# Patient Record
Sex: Female | Born: 1965 | Race: Black or African American | Hispanic: No | Marital: Single
Health system: Southern US, Community
[De-identification: ages and names within clinical notes are randomized; demographics above are authoritative.]

---

## 1997-09-27 ENCOUNTER — Other Ambulatory Visit: Admission: RE | Admit: 1997-09-27 | Discharge: 1997-09-27 | Payer: Self-pay | Admitting: Obstetrics and Gynecology

## 1998-05-13 ENCOUNTER — Other Ambulatory Visit: Admission: RE | Admit: 1998-05-13 | Discharge: 1998-05-13 | Payer: Self-pay | Admitting: Obstetrics and Gynecology

## 1999-05-19 ENCOUNTER — Other Ambulatory Visit: Admission: RE | Admit: 1999-05-19 | Discharge: 1999-05-19 | Payer: Self-pay | Admitting: *Deleted

## 2000-06-16 ENCOUNTER — Other Ambulatory Visit: Admission: RE | Admit: 2000-06-16 | Discharge: 2000-06-16 | Payer: Self-pay | Admitting: *Deleted

## 2000-09-12 ENCOUNTER — Ambulatory Visit (HOSPITAL_COMMUNITY): Admission: RE | Admit: 2000-09-12 | Discharge: 2000-09-12 | Payer: Self-pay | Admitting: *Deleted

## 2001-06-29 ENCOUNTER — Other Ambulatory Visit: Admission: RE | Admit: 2001-06-29 | Discharge: 2001-06-29 | Payer: Self-pay | Admitting: *Deleted

## 2001-12-06 ENCOUNTER — Encounter (INDEPENDENT_AMBULATORY_CARE_PROVIDER_SITE_OTHER): Payer: Self-pay

## 2001-12-06 ENCOUNTER — Observation Stay (HOSPITAL_COMMUNITY): Admission: RE | Admit: 2001-12-06 | Discharge: 2001-12-07 | Payer: Self-pay | Admitting: Obstetrics and Gynecology

## 2002-11-12 ENCOUNTER — Other Ambulatory Visit: Admission: RE | Admit: 2002-11-12 | Discharge: 2002-11-12 | Payer: Self-pay | Admitting: Obstetrics and Gynecology

## 2004-02-26 ENCOUNTER — Other Ambulatory Visit: Admission: RE | Admit: 2004-02-26 | Discharge: 2004-02-26 | Payer: Self-pay | Admitting: Obstetrics and Gynecology

## 2004-03-10 ENCOUNTER — Encounter: Admission: RE | Admit: 2004-03-10 | Discharge: 2004-03-10 | Payer: Self-pay | Admitting: Obstetrics and Gynecology

## 2005-03-24 ENCOUNTER — Other Ambulatory Visit: Admission: RE | Admit: 2005-03-24 | Discharge: 2005-03-24 | Payer: Self-pay | Admitting: Obstetrics and Gynecology

## 2009-06-18 ENCOUNTER — Emergency Department (HOSPITAL_COMMUNITY): Admission: EM | Admit: 2009-06-18 | Discharge: 2009-06-18 | Payer: Self-pay | Admitting: Emergency Medicine

## 2009-06-20 ENCOUNTER — Emergency Department (HOSPITAL_COMMUNITY): Admission: EM | Admit: 2009-06-20 | Discharge: 2009-06-20 | Payer: Self-pay | Admitting: Emergency Medicine

## 2009-07-25 ENCOUNTER — Ambulatory Visit (HOSPITAL_COMMUNITY): Admission: RE | Admit: 2009-07-25 | Discharge: 2009-07-25 | Payer: Self-pay | Admitting: General Surgery

## 2010-09-21 LAB — COMPREHENSIVE METABOLIC PANEL
ALT: 50 U/L — ABNORMAL HIGH (ref 0–35)
Albumin: 3.9 g/dL (ref 3.5–5.2)
Alkaline Phosphatase: 55 U/L (ref 39–117)
BUN: 9 mg/dL (ref 6–23)
Calcium: 9.7 mg/dL (ref 8.4–10.5)
Potassium: 3.4 mEq/L — ABNORMAL LOW (ref 3.5–5.1)
Sodium: 139 mEq/L (ref 135–145)
Total Protein: 7.1 g/dL (ref 6.0–8.3)

## 2010-09-21 LAB — CBC
HCT: 39 % (ref 36.0–46.0)
MCHC: 33.7 g/dL (ref 30.0–36.0)
MCV: 86 fL (ref 78.0–100.0)
RBC: 4.54 MIL/uL (ref 3.87–5.11)
WBC: 8.2 10*3/uL (ref 4.0–10.5)

## 2010-09-21 LAB — DIFFERENTIAL
Basophils Absolute: 0.1 10*3/uL (ref 0.0–0.1)
Eosinophils Absolute: 0.1 10*3/uL (ref 0.0–0.7)
Eosinophils Relative: 2 % (ref 0–5)
Lymphocytes Relative: 26 % (ref 12–46)
Neutrophils Relative %: 67 % (ref 43–77)

## 2010-10-05 LAB — POCT I-STAT, CHEM 8
BUN: 9 mg/dL (ref 6–23)
Calcium, Ion: 1.1 mmol/L — ABNORMAL LOW (ref 1.12–1.32)
Chloride: 99 mEq/L (ref 96–112)
Glucose, Bld: 150 mg/dL — ABNORMAL HIGH (ref 70–99)
Potassium: 2.9 mEq/L — ABNORMAL LOW (ref 3.5–5.1)

## 2010-10-05 LAB — URINALYSIS, ROUTINE W REFLEX MICROSCOPIC
Glucose, UA: NEGATIVE mg/dL
Hgb urine dipstick: NEGATIVE
Protein, ur: NEGATIVE mg/dL
Specific Gravity, Urine: 1.017 (ref 1.005–1.030)
pH: 6 (ref 5.0–8.0)

## 2010-10-05 LAB — DIFFERENTIAL
Basophils Relative: 1 % (ref 0–1)
Eosinophils Absolute: 0.2 10*3/uL (ref 0.0–0.7)
Lymphocytes Relative: 15 % (ref 12–46)

## 2010-10-05 LAB — CBC
HCT: 41.7 % (ref 36.0–46.0)
Hemoglobin: 13.7 g/dL (ref 12.0–15.0)
WBC: 11.4 10*3/uL — ABNORMAL HIGH (ref 4.0–10.5)

## 2010-10-05 LAB — URINE CULTURE: Colony Count: 15000

## 2010-10-05 LAB — HEPATIC FUNCTION PANEL
ALT: 253 U/L — ABNORMAL HIGH (ref 0–35)
AST: 151 U/L — ABNORMAL HIGH (ref 0–37)
Alkaline Phosphatase: 147 U/L — ABNORMAL HIGH (ref 39–117)
Bilirubin, Direct: 1.1 mg/dL — ABNORMAL HIGH (ref 0.0–0.3)
Total Bilirubin: 2 mg/dL — ABNORMAL HIGH (ref 0.3–1.2)

## 2010-10-05 LAB — LIPASE, BLOOD: Lipase: 20 U/L (ref 11–59)

## 2010-10-06 LAB — DIFFERENTIAL
Basophils Relative: 1 % (ref 0–1)
Lymphocytes Relative: 14 % (ref 12–46)
Lymphs Abs: 1.7 10*3/uL (ref 0.7–4.0)
Monocytes Absolute: 0.4 10*3/uL (ref 0.1–1.0)
Monocytes Relative: 3 % (ref 3–12)
Neutro Abs: 10.2 10*3/uL — ABNORMAL HIGH (ref 1.7–7.7)
Neutrophils Relative %: 82 % — ABNORMAL HIGH (ref 43–77)

## 2010-10-06 LAB — URINALYSIS, ROUTINE W REFLEX MICROSCOPIC
Bilirubin Urine: NEGATIVE
Glucose, UA: NEGATIVE mg/dL
Hgb urine dipstick: NEGATIVE
Nitrite: NEGATIVE
Protein, ur: NEGATIVE mg/dL

## 2010-10-06 LAB — COMPREHENSIVE METABOLIC PANEL
ALT: 38 U/L — ABNORMAL HIGH (ref 0–35)
AST: 67 U/L — ABNORMAL HIGH (ref 0–37)
Calcium: 9.5 mg/dL (ref 8.4–10.5)
Potassium: 3.1 mEq/L — ABNORMAL LOW (ref 3.5–5.1)
Sodium: 139 mEq/L (ref 135–145)
Total Protein: 7.3 g/dL (ref 6.0–8.3)

## 2010-10-06 LAB — LIPASE, BLOOD: Lipase: 22 U/L (ref 11–59)

## 2010-10-06 LAB — CBC
Hemoglobin: 12.7 g/dL (ref 12.0–15.0)
MCHC: 33.4 g/dL (ref 30.0–36.0)
MCV: 85.2 fL (ref 78.0–100.0)
RBC: 4.48 MIL/uL (ref 3.87–5.11)
RDW: 14.1 % (ref 11.5–15.5)

## 2010-10-06 LAB — CK TOTAL AND CKMB (NOT AT ARMC): Total CK: 98 U/L (ref 7–177)

## 2010-11-12 ENCOUNTER — Other Ambulatory Visit: Payer: Self-pay | Admitting: Family Medicine

## 2010-11-12 ENCOUNTER — Other Ambulatory Visit (HOSPITAL_COMMUNITY)
Admission: RE | Admit: 2010-11-12 | Discharge: 2010-11-12 | Disposition: A | Discharge status: Home / Self Care | Payer: 59 | Source: Ambulatory Visit | Attending: Family Medicine | Admitting: Family Medicine

## 2010-11-12 DIAGNOSIS — Z124 Encounter for screening for malignant neoplasm of cervix: Secondary | ICD-10-CM | POA: Insufficient documentation

## 2010-11-20 NOTE — Op Note (Signed)
[facility]  Patient:    Vickie, Wyatt                         MRN: [id number] Proc. Date: 09/12/00 Adm. Date:  [id number] Attending:  Marin Comment                           Operative Report  PREOPERATIVE DIAGNOSIS:  Desires permanent sterilization.  POSTOPERATIVE DIAGNOSIS:  Desires permanent sterilization.  OPERATION:  Bilateral tubal ligation with cautery using laparoscopic technique.  SURGEON:  Pershing Cox, M.D.  ANESTHESIA:  General endotracheal anesthesia.  INDICATIONS:  This patient is 45 years old.  She is gravida 0.  She is coitally active but has absolutely no plans for pregnancy.  Lengthy conversations were carried out on two occasions, and she is certain that she never wants to have a pregnancy and has been certain of this all of her life. She is coitally active.  She has been on oral contraceptives but has recently developed hypertension, and this has contraindicated the use of a birth control pill.  She continues to have hot flashes on oral contraceptives even though an [facility] done in February of this year was in the normal range.  After careful counselling, she requested tubal sterilization and is brought to the operating room today for this procedure.  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  Her last two Pap smears have been within normal limits.  DESCRIPTION OF PROCEDURE:  Torina Ey was brought to the operating room with an IV in place.  In the holding area, she received 1 g Ancef.  She was taken to the operating room, placed supine on the OR table, and general endotracheal anesthesia was administered.  She was then placed into Allen stirrups, and her anterior abdominal wall, perineum, and vagina were prepped with Hibiclens. Careful attention was paid to the umbilicus which was prepped with Q-tips. The patient was draped for an abdominal and vaginal  procedure.  Bivalve speculum was then inserted into the vagina.  A single-tooth tenaculum was used to grasp the anterior cervix.  Hulka tenaculum was easily inserted into the uterine cavity.  The umbilicus was everted using Allis clamps, and a curvilinear incision was made at the base of the umbilicus.  This was spread with Kelly clamp.  Veress needle was then used to pass into the peritoneal cavity.  This was accomplished with one pass.  Pressures were appropriate by lifting the anterior abdominal wall, and CO2 gas was insufflated until the patients abdominal pressure was 25.  The 10-11 trocar was introduced into the peritoneal cavity, and the pressures were inappropriate.  It was drawn back, and the pressures were still inappropriate.  The trocar was removed and reinserted.  This time, the pressures were appropriate. The laparoscope was introduced into the peritoneal cavity and photographs were taken of the abdomen immediately upon entry.  Lifting the Hulka tenaculum, the uterus was visualized, and it was appropriate to proceed with the tubal sterilization. (See operative findings.)  Using the Kleppinger bipolar cautery, the isthmic portion of the fallopian tube was grasped using cautery until complete coagulation was indicated by a reading of 0 transduction.  There were three burns on each tube.  A decision was made not to divide the tube because it would taken another port to safely do this.  This  was performed on both sides. Photographs were taken of each ovary and fallopian tube prior to cauterization and after cauterization.  Attempts were made to visualize the appendix, but I was not able to see the appendix due to the patients habitus.  The gas was evacuated from the peritoneal cavity after the laparoscope was removed. Marcaine 0.25% was injected into the fascia and into the subcutaneous tissues; 0 Vicryl was used to close the subcutaneous tissue over the fascia.  The fascial  incision itself was too deep to safely be closed.  Although attempts were made to try to grasp it, I felt that this was really unsafe.  Skin edges were brought together, and Dermabond was used to close the umbilical incision.  OPERATIVE FINDINGS:  The patients uterus was approximately 10 weeks in size. There were several subserosal and pedunculated myomas.  There are also several budding myomas arising from the uterine surface.  None of these seem to be involved in the broad ligament.  Condition at the end of surgery is stable.  Specimens none. DD:  09/12/00 TD:  09/13/00 Job: [id number]

## 2010-11-20 NOTE — Op Note (Signed)
[facility]  Patient:    Vickie Wyatt, Vickie Wyatt Visit Number: [id number] MRN: [id number]          Service Type: DSU Location: [id number] Attending Physician:  Soledad Gerlach Dictated by:   Guy Sandifer Arleta Creek, M.D. Proc. Date: 12/06/01 Admit Date:  12/06/2001 Discharge Date: 12/07/2001                             Operative Report  PREOPERATIVE DIAGNOSES:       1. Uterine leiomyomata.                               2. Menorrhagia.  POSTOPERATIVE DIAGNOSES:      1. Uterine leiomyomata.                               2. Menorrhagia.  OPERATION:                    Laparoscopic-assisted vaginal hysterectomy.  SURGEON:                      Guy Sandifer. Arleta Creek, M.D.  ASSISTANT:                    Trevor Iha, M.D.  ANESTHESIA:                   General with endotracheal intubation - J.                               Leilani Able, Montez Hageman., M.D.  ESTIMATED BLOOD LOSS:         200 cc.  INDICATIONS AND CONSENT:      The patient is a 45 year old single black female G0, P0, status post tubal ligation with uterine leiomyomata.  Details are dictated in the History and Physical.  Laparoscopically-assisted vaginal hysterectomy was discussed with the patient.  An ovary will be removed only if distinctly abnormal.  Potential risks and complications have been discussed preoperatively including, but not limited to infection, bowel, bladder, or ureteral damage, bleeding requiring transfusion of blood products with possible transfusion reaction, HIV and hepatitis acquisition, DVT, PE, pneumonia, fistula formation, postoperative dyspareunia, laparotomy.  All questions have been answered and consent is signed on the chart.  FINDINGS:                     Upper abdomen grossly normal.  Tubes - status post ligation bilaterally.  Ovaries are normal.  Anterior and posterior cul-de-sac is normal.  Uterus is approximately eight weeks in size.  Quite irregular  contour with multiple subserosal intramural leiomyomata.  DESCRIPTION OF PROCEDURE:     The patient was taken to the operating room and placed in the dorsal supine position where general anesthesia was induced via endotracheal intubation.  She was then placed in the dorsolithotomy position where she was prepped abdominally and vaginally.  The bladder was straight catheterized.  A Hulka tenaculum was placed and the uterus was manipulated. She was draped in a sterile fashion.  A small infraumbilical incision was made and a 10-11 disposable trocar sleeve was placed on the first attempt without difficulty.  Placement was verified with the laparoscope and no damage  to the surrounding structures was noted.  Pneumoperitoneum is induced and a small suprapubic incision is made and a 5 mm nondisposable trocar sleeve was placed under direct visualization without difficulty.  The above findings were noted. The course of the ureters was identified bilaterally and seemed to be well-clear of the area of surgery.  Then, using the 5 mm cautery cutting device through the operative laparoscope, the proximal ligaments were taken down bilaterally to the level of the vesicouterine peritoneum.  The vesicouterine peritoneum was incised in the midline and this was carried out bilaterally to completely take down the vesicouterine peritoneum.  Good hemostasis was noted.  Instruments are removed and attention is turned to the vagina.  The posterior cul-de-sac is entered sharply without difficulty.  The cervix is circumscribed with the scalpel and the mucosa is advanced sharply and bluntly. Uterosacral ligaments were taken down with the cautery device bilaterally. The bladder pillars and cardinal ligaments were taken down bilaterally.  The bladder is dissected upward and the uterine vessels were taken down bilaterally.  The anterior cul-de-sac is entered.  The remaining ligaments are taken down and the specimen is  delivered.  There is a bleeding vessel requiring a free tie on the right angle and a suture ligature on the left angle of 0 Monocryl.  The uterosacral ligaments were then plicated in the vaginal cuff bilaterally with 0 Monocryl.  All sutures of 0 Monocryl unless otherwise designated.  The cuff was then closed with figure-of-eight sutures. A Foley catheter was placed in the bladder and clear urine is noted.  Attention was then returned to the abdomen.  Copious irrigation was carried out and excellent hemostasis was noted all around.  Pneumoperitoneum is greatly reduced and careful observation again reveals good hemostasis.  Excess fluid is removed.  The suprapubic trocar sleeve is removed.  Pneumoperitoneum was once again reduced and again good hemostasis was noted from all sites. Pneumoperitoneum was completely reduced and umbilical trocar sleeve was removed.  The subcutaneous tissues were reapproximated with a single 2-0 Vicryl suture with care being taken not to pick up any underlying structures in the umbilical incision.  The incisions were then injected with 0.5% plain Marcaine and the skin was closed at both sites with Dermabond.  All counts were correct.  The patient was awakened and taken to the recovery room in stable condition. Dictated by:   Guy Sandifer Arleta Creek, M.D. Attending Physician:  Soledad Gerlach DD:  12/06/01 TD:  12/07/01 Job: [id number]

## 2010-11-20 NOTE — H&P (Signed)
[facility]  Patient:    Vickie Wyatt, Vickie Wyatt Visit Number: [id number] MRN: [id number]          Service Type: Attending:  Guy Sandifer. Arleta Creek, M.D. Dictated by:   Guy Sandifer Arleta Creek, M.D. Adm. Date:  12/06/01                           History and Physical  CHIEF COMPLAINT:              Uterine fibroids.  HISTORY OF PRESENT ILLNESS:   This patient is a 45 year old single black female, G0, P0, status post tubal ligation, with known uterine leiomyomata. These were noted at the time of laparoscopy and tubal ligation approximately one year ago.  In the intervening year the patient has had continued heavy bleeding and dysmenorrhea.  Her menstrual flows will last seven to eight days. On the second and third days she changes the tampon plus a pad every two hours.  She has significant cramping beginning before the menses and lasting through the heavy flows.  After discussion of the options, she is being admitted for laparoscopically assisted vaginal hysterectomy.  PAST MEDICAL HISTORY:         Chronic hypertension.  PAST SURGICAL HISTORY:        Laparoscopy with tubal ligation.  MEDICATIONS:                  Atenolol 100 mg daily, Axert p.r.n. headache.  ALLERGIES:                    AMOXICILLIN leads to a rash and burning.  FAMILY HISTORY:               Positive for unknown type of cancer in father, extensive family history of chronic hypertension, thyroid problem in aunt.  OBSTETRIC HISTORY:            Negative.  SOCIAL HISTORY:               The patient consumes alcohol on a social basis, denies alcohol or drug abuse.  REVIEW OF SYSTEMS:            Negative except as above.  PHYSICAL EXAMINATION:  VITAL SIGNS:                  Height 5 feet 2 inches, weight 174 pounds. Blood pressure 110/78.  NECK:                         Without thyromegaly.  CHEST:                        Lungs clear to auscultation.  CARDIAC:                      Regular rate  and rhythm.  BACK:                         Without CVA tenderness.  BREASTS:                      Without mass, retraction, or discharge.  ABDOMEN:                      Soft, nontender, without masses.  PELVIC:  Vulva, vagina, and cervix without lesion. Uterus is irregular in contour, slightly tender in the left fundus, and approximately 9-10 weeks in size.  Adnexa are nontender.  Uterus is very mobile.  EXTREMITIES:                  Grossly within normal limits.  NEUROLOGIC:                   Grossly within normal limits.  ASSESSMENT:                   Uterine leiomyomata with menorrhagia and dysmenorrhea.  PLAN:                         Laparoscopically assisted vaginal hysterectomy. Dictated by:   Guy Sandifer Arleta Creek, M.D. Attending:  Guy Sandifer Arleta Creek, M.D. DD:  12/04/01 TD:  12/04/01 Job: [id number]

## 2011-09-23 IMAGING — CR DG ABDOMEN ACUTE W/ 1V CHEST
3 series · 3 of 3 positions shown · non-contrast
Comparison: None

CLINICAL DATA: Abdominal pain.

ACUTE ABDOMEN SERIES (ABDOMEN 2 VIEW & CHEST 1 VIEW)

[w chest pa]
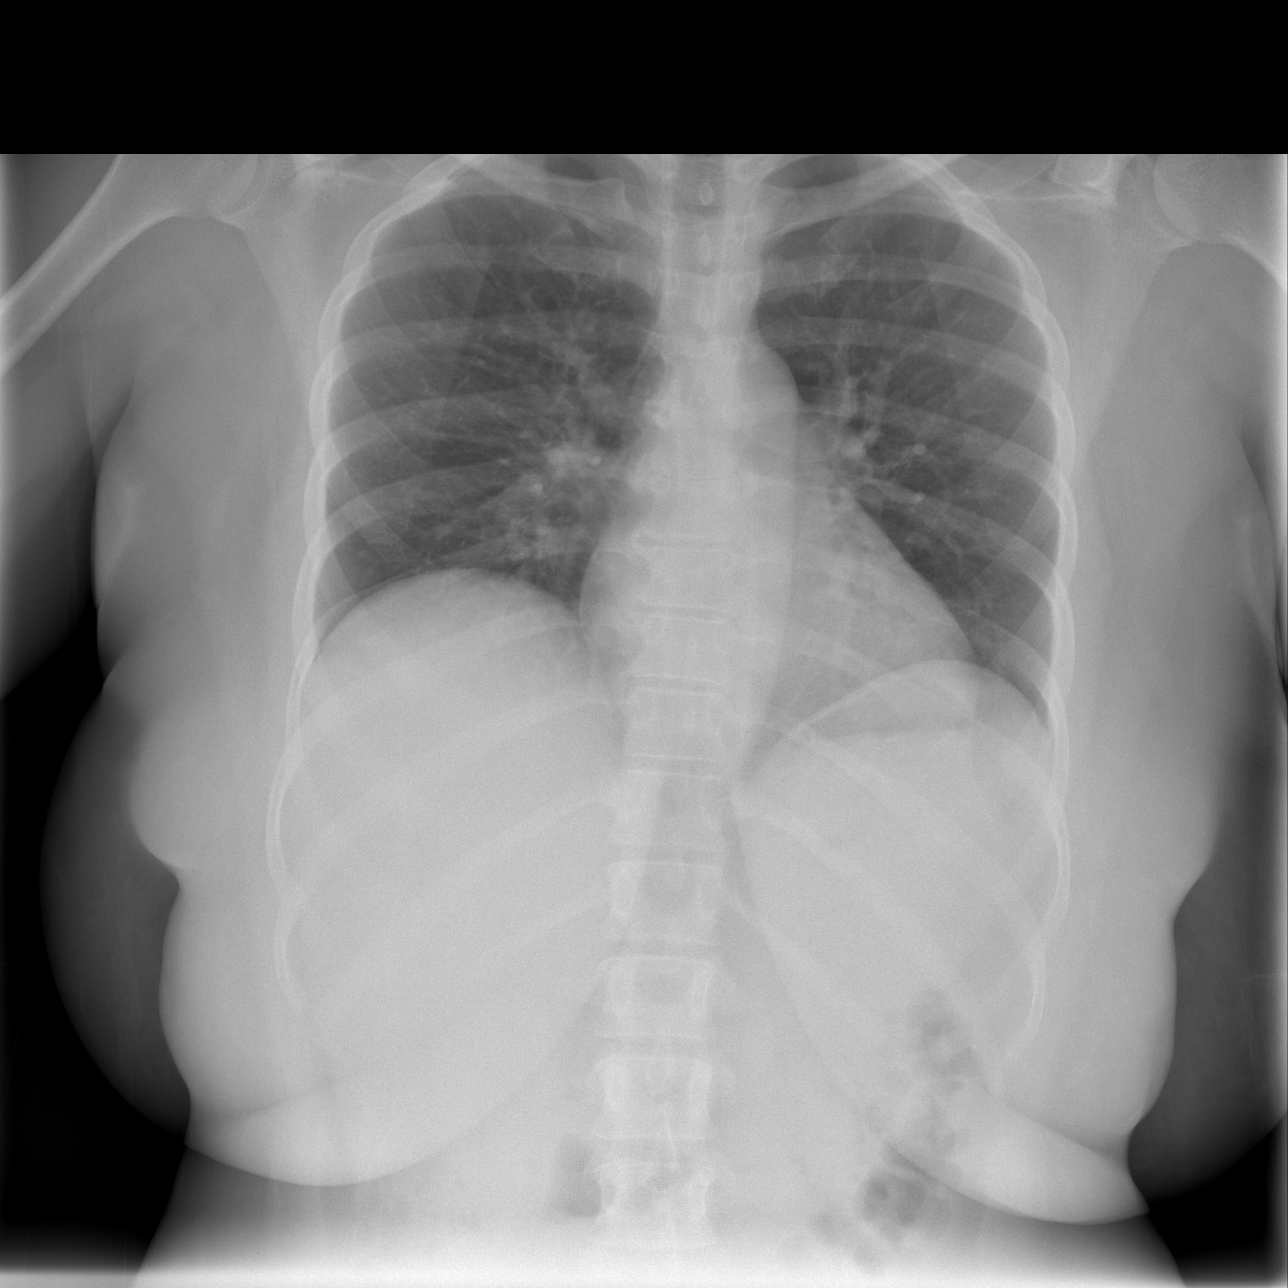

[w abdomen upright *]
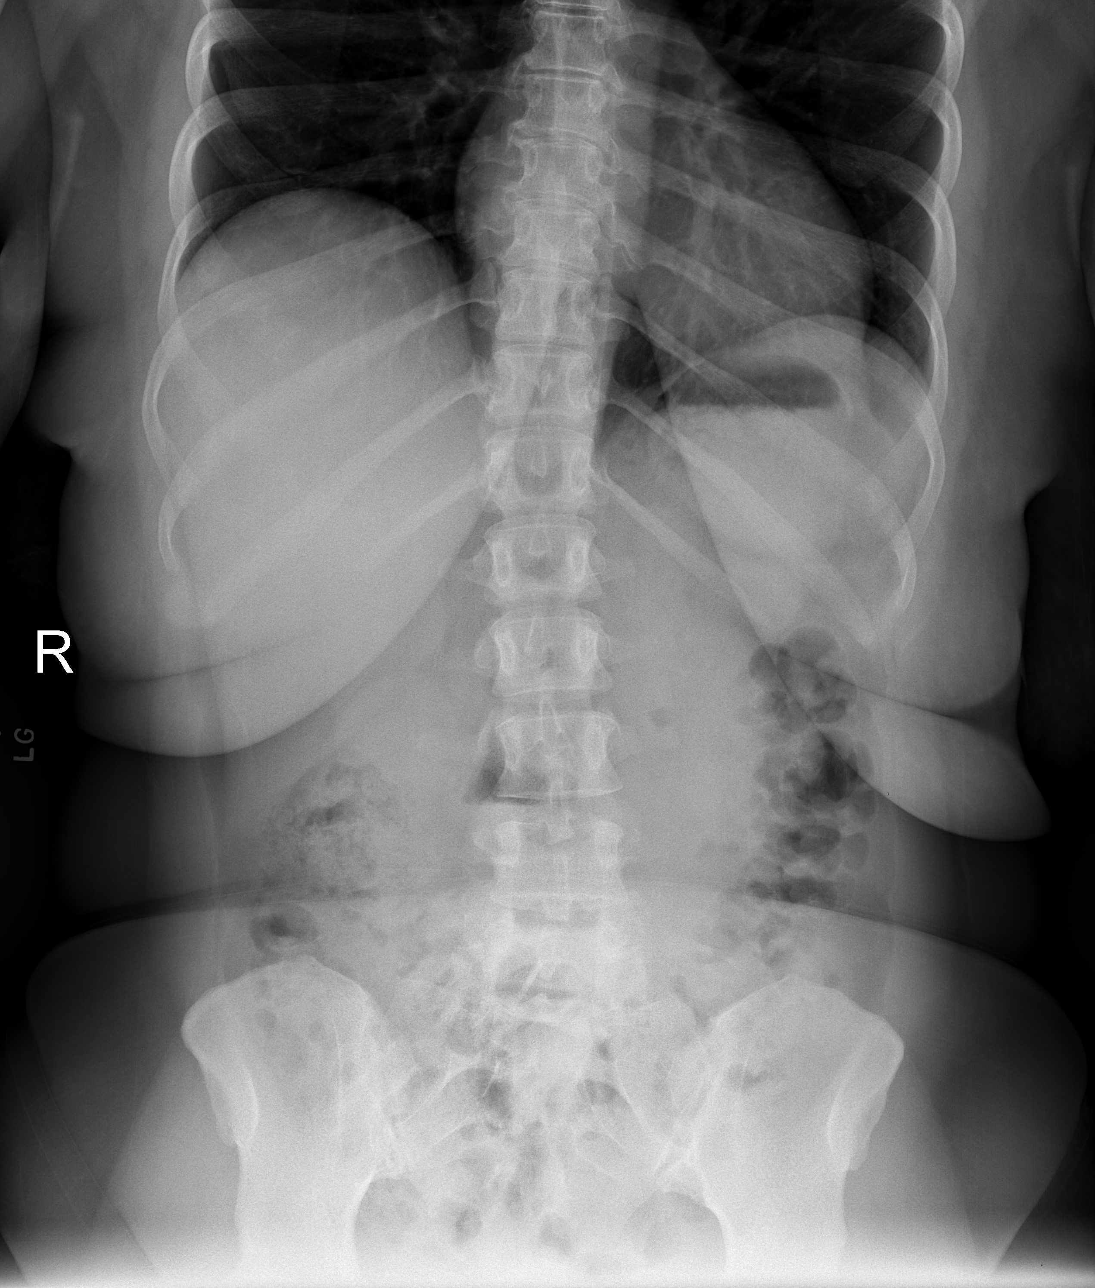

[t abdomen supine]
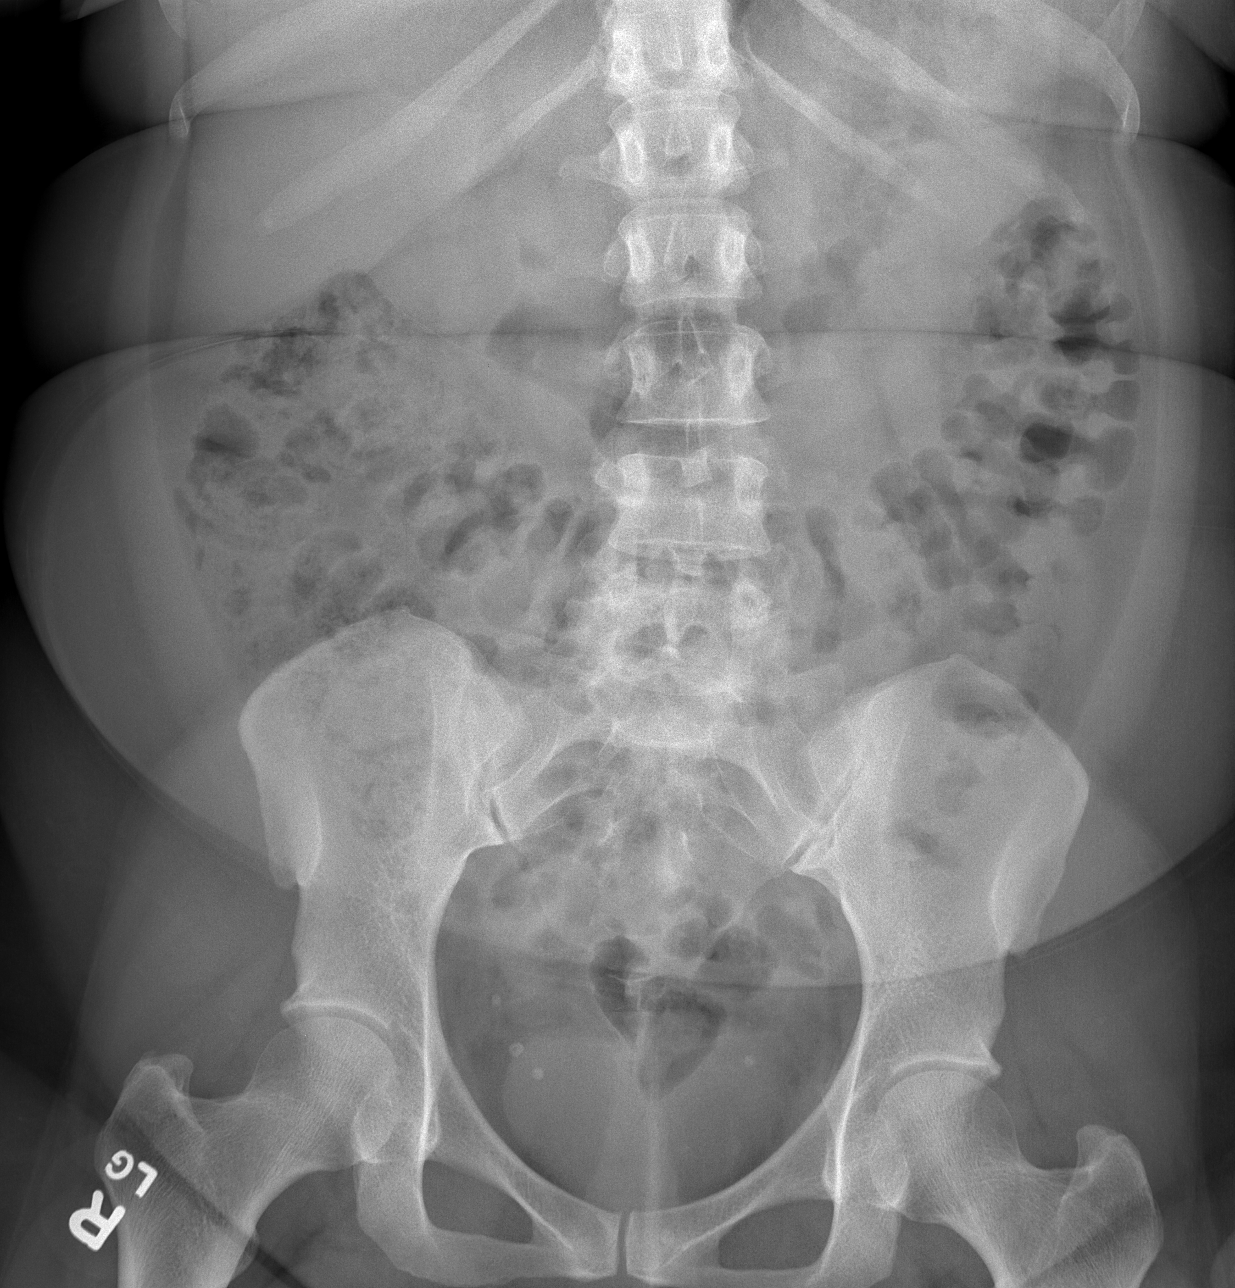

[3 of 3 positions shown; findings below may reference images not displayed]

FINDINGS: The upright chest x-ray demonstrates no acute
cardiopulmonary findings.  Two views of the abdomen demonstrate an
unremarkable bowel gas pattern.  There is scattered air and stool
in the colon.  No dilated loops of small bowel to suggest
obstruction.  Soft tissue shadows of the abdomen are maintained.
No worrisome calcifications are seen.  The bony structures are
unremarkable.
IMPRESSION: 1.  No acute cardiopulmonary findings.
2.  No plain film findings for an acute abdominal process.

## 2011-11-30 ENCOUNTER — Other Ambulatory Visit: Payer: Self-pay | Admitting: Family Medicine

## 2011-11-30 ENCOUNTER — Ambulatory Visit
Admission: RE | Admit: 2011-11-30 | Discharge: 2011-11-30 | Disposition: A | Discharge status: Home / Self Care | Payer: 59 | Source: Ambulatory Visit | Attending: Family Medicine | Admitting: Family Medicine

## 2011-11-30 DIAGNOSIS — M25571 Pain in right ankle and joints of right foot: Secondary | ICD-10-CM

## 2012-05-19 ENCOUNTER — Other Ambulatory Visit: Payer: Self-pay | Admitting: Rheumatology

## 2012-05-19 DIAGNOSIS — M064 Inflammatory polyarthropathy: Secondary | ICD-10-CM

## 2012-05-22 ENCOUNTER — Inpatient Hospital Stay: Admission: RE | Admit: 2012-05-22 | Payer: 59 | Source: Ambulatory Visit

## 2012-05-25 ENCOUNTER — Ambulatory Visit: Payer: 59 | Attending: Rheumatology | Admitting: Physical Therapy

## 2012-05-25 DIAGNOSIS — M25579 Pain in unspecified ankle and joints of unspecified foot: Secondary | ICD-10-CM | POA: Insufficient documentation

## 2012-05-25 DIAGNOSIS — IMO0001 Reserved for inherently not codable concepts without codable children: Secondary | ICD-10-CM | POA: Insufficient documentation

## 2014-04-05 ENCOUNTER — Other Ambulatory Visit: Payer: Self-pay | Admitting: Family Medicine

## 2014-04-05 ENCOUNTER — Other Ambulatory Visit (HOSPITAL_COMMUNITY)
Admission: RE | Admit: 2014-04-05 | Discharge: 2014-04-05 | Disposition: A | Discharge status: Home / Self Care | Payer: 59 | Source: Ambulatory Visit | Attending: Family Medicine | Admitting: Family Medicine

## 2014-04-05 DIAGNOSIS — Z124 Encounter for screening for malignant neoplasm of cervix: Secondary | ICD-10-CM | POA: Insufficient documentation

## 2014-04-08 LAB — CYTOLOGY - PAP

## 2015-04-22 ENCOUNTER — Other Ambulatory Visit: Payer: Self-pay

## 2015-04-22 DIAGNOSIS — Z1231 Encounter for screening mammogram for malignant neoplasm of breast: Secondary | ICD-10-CM

## 2015-04-30 ENCOUNTER — Ambulatory Visit
Admission: RE | Admit: 2015-04-30 | Discharge: 2015-04-30 | Disposition: A | Discharge status: Home / Self Care | Payer: BLUE CROSS/BLUE SHIELD | Source: Ambulatory Visit

## 2015-04-30 DIAGNOSIS — Z1231 Encounter for screening mammogram for malignant neoplasm of breast: Secondary | ICD-10-CM

## 2016-04-15 ENCOUNTER — Other Ambulatory Visit: Payer: Self-pay | Admitting: Family Medicine

## 2016-04-15 DIAGNOSIS — Z1231 Encounter for screening mammogram for malignant neoplasm of breast: Secondary | ICD-10-CM

## 2016-05-12 ENCOUNTER — Ambulatory Visit
Admission: RE | Admit: 2016-05-12 | Discharge: 2016-05-12 | Disposition: A | Discharge status: Home / Self Care | Payer: BLUE CROSS/BLUE SHIELD | Source: Ambulatory Visit | Attending: Family Medicine | Admitting: Family Medicine

## 2016-05-12 DIAGNOSIS — Z1231 Encounter for screening mammogram for malignant neoplasm of breast: Secondary | ICD-10-CM

## 2017-04-21 ENCOUNTER — Other Ambulatory Visit (HOSPITAL_COMMUNITY)
Admission: RE | Admit: 2017-04-21 | Discharge: 2017-04-21 | Disposition: A | Discharge status: Home / Self Care | Payer: BLUE CROSS/BLUE SHIELD | Source: Ambulatory Visit | Attending: Family Medicine | Admitting: Family Medicine

## 2017-04-21 ENCOUNTER — Other Ambulatory Visit: Payer: Self-pay | Admitting: Family Medicine

## 2017-04-21 DIAGNOSIS — Z01411 Encounter for gynecological examination (general) (routine) with abnormal findings: Secondary | ICD-10-CM | POA: Insufficient documentation

## 2017-04-22 LAB — CYTOLOGY - PAP
Diagnosis: NEGATIVE
HPV: NOT DETECTED

## 2017-04-25 ENCOUNTER — Other Ambulatory Visit: Payer: Self-pay | Admitting: Family Medicine

## 2017-04-25 DIAGNOSIS — Z1231 Encounter for screening mammogram for malignant neoplasm of breast: Secondary | ICD-10-CM

## 2017-05-24 ENCOUNTER — Ambulatory Visit
Admission: RE | Admit: 2017-05-24 | Discharge: 2017-05-24 | Disposition: A | Discharge status: Home / Self Care | Payer: No Typology Code available for payment source | Source: Ambulatory Visit | Attending: Family Medicine | Admitting: Family Medicine

## 2017-05-24 ENCOUNTER — Ambulatory Visit: Payer: BLUE CROSS/BLUE SHIELD

## 2017-05-24 DIAGNOSIS — Z1231 Encounter for screening mammogram for malignant neoplasm of breast: Secondary | ICD-10-CM

## 2018-11-17 ENCOUNTER — Other Ambulatory Visit: Payer: Self-pay | Admitting: Family Medicine

## 2018-11-17 DIAGNOSIS — Z1231 Encounter for screening mammogram for malignant neoplasm of breast: Secondary | ICD-10-CM

## 2019-01-10 ENCOUNTER — Ambulatory Visit
Admission: RE | Admit: 2019-01-10 | Discharge: 2019-01-10 | Disposition: A | Discharge status: Home / Self Care | Payer: 59 | Source: Ambulatory Visit | Attending: Family Medicine | Admitting: Family Medicine

## 2019-01-10 DIAGNOSIS — Z1231 Encounter for screening mammogram for malignant neoplasm of breast: Secondary | ICD-10-CM

## 2019-02-01 ENCOUNTER — Other Ambulatory Visit: Payer: Self-pay | Admitting: Podiatry

## 2019-02-01 ENCOUNTER — Encounter: Payer: Self-pay | Admitting: Podiatry

## 2019-02-01 ENCOUNTER — Ambulatory Visit: Payer: 59 | Admitting: Podiatry

## 2019-02-01 ENCOUNTER — Other Ambulatory Visit: Payer: Self-pay

## 2019-02-01 ENCOUNTER — Ambulatory Visit (INDEPENDENT_AMBULATORY_CARE_PROVIDER_SITE_OTHER): Payer: 59

## 2019-02-01 VITALS — Temp 97.9°F

## 2019-02-01 DIAGNOSIS — M7751 Other enthesopathy of right foot: Secondary | ICD-10-CM

## 2019-02-01 DIAGNOSIS — M79671 Pain in right foot: Secondary | ICD-10-CM

## 2019-02-01 DIAGNOSIS — M7750 Other enthesopathy of unspecified foot: Secondary | ICD-10-CM

## 2019-02-01 DIAGNOSIS — M722 Plantar fascial fibromatosis: Secondary | ICD-10-CM

## 2019-02-01 DIAGNOSIS — R2240 Localized swelling, mass and lump, unspecified lower limb: Secondary | ICD-10-CM | POA: Diagnosis not present

## 2019-02-01 DIAGNOSIS — M7989 Other specified soft tissue disorders: Secondary | ICD-10-CM

## 2019-02-01 DIAGNOSIS — M659 Synovitis and tenosynovitis, unspecified: Secondary | ICD-10-CM

## 2019-02-01 MED ORDER — METHYLPREDNISOLONE 4 MG PO TBPK: ORAL_TABLET | ORAL | 0 refills | Status: DC

## 2019-02-01 NOTE — Progress Notes (Signed)
Subjective:   Patient ID: Vickie Wyatt, female   DOB: 53 y.o.   MRN: [id number]   HPI 53 year old female presents the office today for concerns of a painful spot on the arch of her right foot.  This been ongoing that she has noticed last 6 months.  She also recently after that started noticed swelling along the ankle pointing to the anterior medial ankle.  She states it is painful on above her foot with pressure.  She is not sure if it is been growing.  She has had no recent treatment other than try to change shoes and wearing inserts.  No numbness or tingling.  No radiating pain.  Review of Systems  All other systems reviewed and are negative.  History reviewed. No pertinent past medical history.  History reviewed. No pertinent surgical history.   Current Outpatient Medications:  .  atenolol (TENORMIN) 100 MG tablet, TK 1/2 T PO QD, Disp: , Rfl:   Allergies  Allergen Reactions  . Amoxicillin   . Penicillins          Objective:  Physical Exam  General: AAO x3, NAD  Dermatological: Skin is warm, dry and supple bilateral. Nails x 10 are well manicured; remaining integument appears unremarkable at this time. There are no open sores, no preulcerative lesions, no rash or signs of infection present.  Vascular: Dorsalis Pedis artery and Posterior Tibial artery pedal pulses are 2/4 bilateral with immedate capillary fill time. Pedal hair growth present. No varicosities and no lower extremity edema present bilateral. There is no pain with calf compression, swelling, warmth, erythema.   Neruologic: Grossly intact via light touch bilateral. Vibratory intact via tuning fork bilateral. Protective threshold with Semmes Wienstein monofilament intact to all pedal sites bilateral.  Negative Tinel sign.  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  Also fluid-filled soft tissue mass in the  anteromedial aspect the ankle joint consistent with a ganglion cyst.  No erythema or warmth.  There is tenderness palpation of both areas.  There is no erythema or warmth.  No other areas of pinpoint tenderness.  Muscular strength 5/5 in all groups tested bilateral.  Gait: Unassisted, Nonantalgic.       Assessment:   Soft tissue mass right ankle and foot     Plan:  -Treatment options discussed including all alternatives, risks, and complications -Etiology of symptoms were discussed -X-rays were obtained and reviewed with the patient.  Skin marker is utilized to identify the area soft tissue mass.  No foreign body or calcifications present.  Soft tissue swelling to the ankle joint.  No evidence of acute fracture or foreign body. -Steroid injection to the plantar fibroma.  Area skin with alcohol.  Patient 1 cc Kenalog 10, 0.5 cc Marcaine plain, 0.5 cc lidocaine plain was infiltrated into the soft tissue as well complications.  Postinjection care discussed. -Offloading pads  -Order compound cream to include verapamil -Order MRI -Medrol dose pack  Return in about 3 weeks (around 02/22/2019).  Trula Slade DPM

## 2019-02-02 ENCOUNTER — Telehealth: Payer: Self-pay | Admitting: *Deleted

## 2019-02-02 DIAGNOSIS — M7989 Other specified soft tissue disorders: Secondary | ICD-10-CM

## 2019-02-02 DIAGNOSIS — M722 Plantar fascial fibromatosis: Secondary | ICD-10-CM

## 2019-02-02 DIAGNOSIS — M79671 Pain in right foot: Secondary | ICD-10-CM

## 2019-02-02 NOTE — Telephone Encounter (Signed)
-----   Message from Trula Slade, DPM sent at 02/01/2019  7:25 PM EDT ----- Val- can you please order an MRI of the right foot and ankle to evaluate soft tissue mass to the ankle and plantar foot?  Lattie Haw- can you fax a [employer] cream form for the scar (the one that includes the verapamil)?   Thanks.

## 2019-02-02 NOTE — Telephone Encounter (Signed)
Orders to [facility] and Gretta Arab, RN for precert.

## 2019-02-22 ENCOUNTER — Other Ambulatory Visit: Payer: Self-pay

## 2019-02-22 ENCOUNTER — Ambulatory Visit (INDEPENDENT_AMBULATORY_CARE_PROVIDER_SITE_OTHER): Payer: 59 | Admitting: Podiatry

## 2019-02-22 ENCOUNTER — Encounter: Payer: Self-pay | Admitting: Podiatry

## 2019-02-22 VITALS — Temp 97.7°F

## 2019-02-22 DIAGNOSIS — R2240 Localized swelling, mass and lump, unspecified lower limb: Secondary | ICD-10-CM | POA: Diagnosis not present

## 2019-02-22 DIAGNOSIS — M722 Plantar fascial fibromatosis: Secondary | ICD-10-CM

## 2019-02-22 DIAGNOSIS — M7989 Other specified soft tissue disorders: Secondary | ICD-10-CM

## 2019-02-22 MED ORDER — MELOXICAM 15 MG PO TABS: 15.0000 mg | ORAL_TABLET | Freq: Every day | ORAL | 0 refills | Status: DC

## 2019-02-22 NOTE — Patient Instructions (Signed)

## 2019-02-23 NOTE — Progress Notes (Signed)
Subjective: 53 year old female presents the office today for follow-up evaluation of right foot and ankle pain.  Overall she is doing better.  The swelling has improved and she states it got much better when she was on the Medrol Dosepak.  She is also been using the cream to the arch of her foot but not not, bilateral foot is doing better.  The swelling to the ankles also improved.  Tenderness is improved. Denies any systemic complaints such as fevers, chills, nausea, vomiting. No acute changes since last appointment, and no other complaints at this time.   Objective: AAO x3, NAD DP/PT pulses palpable bilaterally, CRT less than 3 seconds On the medial band plantar fashion the arch of the foot is a firm nonmobile soft tissue lesion consistent with a plantar fibroma.  Also there is swelling on the anterior medial aspect ankle joint there is no erythema or warmth.  Subjectively she still continues to both lesions but overall improved.  No open lesions or pre-ulcerative lesions.  No pain with calf compression, swelling, warmth, erythema  Assessment: Plantar fibroma, soft tissue lesion with meals  Plan: -All treatment options discussed with the patient including all alternatives, risks, complications.  -Prescription for MRI.  I discussed repeat injection which was ordered.  Continue with the verapamil cream for the arch of the foot.  Described meloxicam discussed side effects medication.  Ice to the area.  Continue compression.  Stretch exercises. -Patient encouraged to call the office with any questions, concerns, change in symptoms.   Trula Slade DPM

## 2019-03-08 ENCOUNTER — Other Ambulatory Visit: Payer: Self-pay

## 2019-03-08 ENCOUNTER — Ambulatory Visit
Admission: RE | Admit: 2019-03-08 | Discharge: 2019-03-08 | Disposition: A | Discharge status: Home / Self Care | Payer: 59 | Source: Ambulatory Visit | Attending: Podiatry | Admitting: Podiatry

## 2019-03-08 DIAGNOSIS — M7989 Other specified soft tissue disorders: Secondary | ICD-10-CM

## 2019-03-08 DIAGNOSIS — M79671 Pain in right foot: Secondary | ICD-10-CM

## 2019-03-08 DIAGNOSIS — M722 Plantar fascial fibromatosis: Secondary | ICD-10-CM

## 2019-03-08 MED ORDER — GADOBENATE DIMEGLUMINE 529 MG/ML IV SOLN
17.0000 mL | Freq: Once | INTRAVENOUS | Status: AC | PRN
  Administered 2019-03-08: 17 mL via INTRAVENOUS

## 2019-03-09 ENCOUNTER — Telehealth: Payer: Self-pay | Admitting: *Deleted

## 2019-03-09 NOTE — Telephone Encounter (Signed)
Left message requesting pt call 03/13/2019 to discuss results and to continue the compression sock, mobic and the compound cream.

## 2019-03-09 NOTE — Telephone Encounter (Signed)
-----   Message from Trula Slade, DPM sent at 03/09/2019 10:20 AM EDT ----- Val- please let her know the MRI does show a fibroma on the bottom of the foot and some fluid in the ankle joint. There is an old tear of one of the ankle ligaments on the outside of the ankle but this is chronic. Can you see how she is doing? I would continue with the compression anklet, Mobic as needed for swelling. If she got the compound cream for the plantar fibroma (the scar cream from [employer]) I would continue with that as well to the bottom of the foot. Thanks.

## 2019-03-13 MED ORDER — MELOXICAM 15 MG PO TABS: 15.0000 mg | ORAL_TABLET | Freq: Every day | ORAL | 0 refills | Status: DC

## 2019-03-13 NOTE — Telephone Encounter (Signed)
Left message on pt's home phone to call for results and instructions. Mailed Dr. Leigh Aurora review of results and instructions to pt.

## 2019-03-13 NOTE — Telephone Encounter (Signed)
I called pt and informed of Dr. Leigh Aurora review of MRI results and orders. Pt states understanding and states the pain and swelling fluctuate with her weight bearing and work, and she took a warm bath after work today and that helped some. I told her that also she can do warm bath and cold pack pulse to the area, which in some cases can give relief. Pt states understanding.

## 2019-03-13 NOTE — Addendum Note (Signed)
Addended by: Harriett Sine D on: 03/13/2019 03:21 PM   Modules accepted: Orders

## 2019-03-13 NOTE — Telephone Encounter (Signed)
Pt called back about her MRI results. Requested a call back and if she doesn't answer, can you leave a detailed message?

## 2019-04-10 ENCOUNTER — Other Ambulatory Visit: Payer: Self-pay | Admitting: Podiatry

## 2019-05-05 ENCOUNTER — Other Ambulatory Visit: Payer: Self-pay | Admitting: Podiatry

## 2019-06-23 ENCOUNTER — Other Ambulatory Visit: Payer: Self-pay | Admitting: Podiatry

## 2019-06-26 ENCOUNTER — Other Ambulatory Visit: Payer: Self-pay

## 2019-06-26 ENCOUNTER — Encounter: Payer: Self-pay | Admitting: Podiatry

## 2019-06-26 ENCOUNTER — Ambulatory Visit: Payer: 59 | Admitting: Podiatry

## 2019-06-26 DIAGNOSIS — M7989 Other specified soft tissue disorders: Secondary | ICD-10-CM

## 2019-06-26 DIAGNOSIS — M722 Plantar fascial fibromatosis: Secondary | ICD-10-CM

## 2019-06-26 DIAGNOSIS — M79671 Pain in right foot: Secondary | ICD-10-CM | POA: Diagnosis not present

## 2019-06-26 DIAGNOSIS — T148XXA Other injury of unspecified body region, initial encounter: Secondary | ICD-10-CM

## 2019-06-27 ENCOUNTER — Telehealth: Payer: Self-pay | Admitting: *Deleted

## 2019-06-27 NOTE — Telephone Encounter (Signed)
Faxed request for MRI disc to [facility].

## 2019-06-27 NOTE — Telephone Encounter (Signed)
Thank you. Do I need to do anything?

## 2019-06-27 NOTE — Telephone Encounter (Signed)
Received notification from [facility], they were not able to copy disc from the MRI machine due to aging out.

## 2019-06-27 NOTE — Telephone Encounter (Signed)
-----   Message from Trula Slade, DPM sent at 06/26/2019  5:34 PM EST ----- Can you please send her MRI out for a second option of the ankle? She has swelling and pain on the anterior medial ankle I would like evaluated further in particular. Thanks.

## 2019-07-05 NOTE — Progress Notes (Signed)
Subjective: 53 year old female presents the office today for follow evaluation of right foot plantar fibroma as well as swelling and pain to the right ankle.  She states the injection did help when she is using the topical cream in order to count apothecary which has not been helpful.  She states that the cream does make it softer but is not make it go away.  She wants to discuss surgical intervention in order to remove the mass.  She still concern about pain to her ankle.  She gets swelling on the anterior medial aspect of the ankle as well as pain to the lateral aspect of the ankle.  No recent injury or falls. Denies any systemic complaints such as fevers, chills, nausea, vomiting. No acute changes since last appointment, and no other complaints at this time.   Objective: AAO x3, NAD DP/PT pulses palpable bilaterally, CRT less than 3 seconds Plantar fibroma remains on the plantar aspect of the right foot with tenderness palpation.  On the anterior medial aspect of the ankle there is localized area of swelling almost appears to be cystic structure.  There is tenderness on the lateral ankle complex on the course the ATFL.  No pain with ankle joint range of motion or subtalar joint range of motion. No open lesions or pre-ulcerative lesions.  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  -We discussed with conservative as well as surgical treatment options.  Discussed steroid injection today.  Ultimately she wants to consider surgery.  We discussed plantar fibroma excision as well as ATFL repair.  However she does have localized swelling to the anterior medial aspect of the ankle.  We will get a second opinion on the MRI.Marland Kitchen  Once we do this will call her and likely schedule surgery and I will have her come in for surgical consent.   She agrees with this plan. -Patient encouraged to call the office with any questions, concerns, change in symptoms.   Trula Slade DPM

## 2019-07-09 ENCOUNTER — Telehealth: Payer: Self-pay | Admitting: Podiatry

## 2019-07-09 NOTE — Telephone Encounter (Signed)
Pt calling to follow up and see if Dr. Jacqualyn Posey has heard anything back on her MRI that was sent out for overread

## 2019-07-09 NOTE — Telephone Encounter (Signed)
I informed pt of Dr. Leigh Aurora statement.

## 2019-07-09 NOTE — Telephone Encounter (Signed)
I will try to call the radiologist that read the MRI this week.

## 2019-07-13 ENCOUNTER — Telehealth: Payer: Self-pay | Admitting: *Deleted

## 2019-07-13 NOTE — Telephone Encounter (Signed)
Called patient per Dr Jacqualyn Posey and relayed the message that Dr Jacqualyn Posey was getting in touch with the radiologist today and would let the patient know by Monday and patient was ok with that and I stated to call the office if any concerns or questions. Vickie Wyatt

## 2019-07-14 ENCOUNTER — Telehealth: Payer: Self-pay | Admitting: Podiatry

## 2019-07-14 NOTE — Telephone Encounter (Signed)
Spoke to the radiologist yesterday. No mass seen on the anterior medial ankle.   I tried calling her yesterday and the phone went silent. I tried calling again today and left VM to call back. Will try again Monday.

## 2019-07-16 ENCOUNTER — Telehealth: Payer: Self-pay | Admitting: *Deleted

## 2019-07-16 NOTE — Telephone Encounter (Signed)
Pt states she is returning Dr. Leigh Aurora call of 07/14/2019.

## 2019-07-17 ENCOUNTER — Other Ambulatory Visit: Payer: Self-pay | Admitting: Podiatry

## 2019-07-17 NOTE — Telephone Encounter (Signed)
Please advise. Vickie Wyatt 

## 2019-07-17 NOTE — Telephone Encounter (Signed)
I am calling you in regards to scheduling your surgery with Dr. Jacqualyn Posey.  I see you have an appointment scheduled for August 02, 2019 to see Dr. Jacqualyn Posey.  Would you like to come in sooner to see Dr. Jacqualyn Posey?  He has an opening for surgery on August 01, 2019 if you would like to have you surgery that date.  He can also do it on August 08, 2019, what ever is good for you.  "Let's schedule it for August 08, 2019 because I have to make sure everything is good with my FMLA."  I understand, I'll schedule your surgery for August 08, 2019.  We'll see you on August 02, 2019 for your consultation with Dr. Jacqualyn Posey.

## 2019-07-17 NOTE — Telephone Encounter (Signed)
I called the patient to go over the MRI.   I spoke with the radiologist who read the MRI and we had another look specifically at the anterior medial ankle. No obvious soft tissue mass noted and there is no underlying pathology noted on MRI. We discussed potential for lipoma. I discussed this with the patient. She is going to be scheduled for surgery for the plantar fibroma, ATFL repair and possible removal of lipoma. We need to have her come in for surgery consent. I went ahead and did a booking sheet.

## 2019-07-24 ENCOUNTER — Telehealth: Payer: Self-pay | Admitting: *Deleted

## 2019-07-24 NOTE — Telephone Encounter (Signed)
I am calling you in regards to your surgery.  We need to reschedule your surgery date.  I was just informed that Dr. Jacqualyn Posey is not going to be doing surgeries on 08/08/2019.  He can do it the following week on August 15, 2019.  I apologize for any inconvenience.  "That's fine.  I need to let my job know because I had already gotten my leave paperwork filled out for that date."  I'll let Levada Dy, Disability Coordinator, know as well.

## 2019-08-02 ENCOUNTER — Encounter: Payer: Self-pay | Admitting: Podiatry

## 2019-08-02 ENCOUNTER — Other Ambulatory Visit: Payer: Self-pay

## 2019-08-02 ENCOUNTER — Ambulatory Visit: Payer: 59 | Admitting: Podiatry

## 2019-08-02 DIAGNOSIS — T148XXA Other injury of unspecified body region, initial encounter: Secondary | ICD-10-CM | POA: Diagnosis not present

## 2019-08-02 DIAGNOSIS — M79676 Pain in unspecified toe(s): Secondary | ICD-10-CM

## 2019-08-02 DIAGNOSIS — M7989 Other specified soft tissue disorders: Secondary | ICD-10-CM

## 2019-08-02 DIAGNOSIS — M722 Plantar fascial fibromatosis: Secondary | ICD-10-CM | POA: Diagnosis not present

## 2019-08-02 NOTE — Patient Instructions (Signed)
Pre-Operative Instructions  Congratulations, you have decided to take an important step towards improving your quality of life.  You can be assured that the doctors and staff at [facility] will be with you every step of the way.  Here are some important things you should know:  1. Plan to be at the surgery center/hospital at least 1 (one) hour prior to your scheduled time, unless otherwise directed by the surgical center/hospital staff.  You must have a responsible adult accompany you, remain during the surgery and drive you home.  Make sure you have directions to the surgical center/hospital to ensure you arrive on time. 2. If you are having surgery at [facility] or [facility], you will need a copy of your medical history and physical form from your family physician within one month prior to the date of surgery. We will give you a form for your primary physician to complete.  3. We make every effort to accommodate the date you request for surgery.  However, there are times where surgery dates or times have to be moved.  We will contact you as soon as possible if a change in schedule is required.   4. No aspirin/ibuprofen for one week before surgery.  If you are on aspirin, any non-steroidal anti-inflammatory medications (Mobic, Aleve, Ibuprofen) should not be taken seven (7) days prior to your surgery.  You make take Tylenol for pain prior to surgery.  5. Medications - If you are taking daily heart and blood pressure medications, seizure, reflux, allergy, asthma, anxiety, pain or diabetes medications, make sure you notify the surgery center/hospital before the day of surgery so they can tell you which medications you should take or avoid the day of surgery. 6. No food or drink after midnight the night before surgery unless directed otherwise by surgical center/hospital staff. 7. No alcoholic beverages 24-hours prior to surgery.  No smoking 24-hours prior or 24-hours after  surgery. 8. Wear loose pants or shorts. They should be loose enough to fit over bandages, boots, and casts. 9. Don't wear slip-on shoes. Sneakers are preferred. 10. Bring your boot with you to the surgery center/hospital.  Also bring crutches or a walker if your physician has prescribed it for you.  If you do not have this equipment, it will be provided for you after surgery. 11. If you have not been contacted by the surgery center/hospital by the day before your surgery, call to confirm the date and time of your surgery. 12. Leave-time from work may vary depending on the type of surgery you have.  Appropriate arrangements should be made prior to surgery with your employer. 13. Prescriptions will be provided immediately following surgery by your doctor.  Fill these as soon as possible after surgery and take the medication as directed. Pain medications will not be refilled on weekends and must be approved by the doctor. 14. Remove nail polish on the operative foot and avoid getting pedicures prior to surgery. 15. Wash the night before surgery.  The night before surgery wash the foot and leg well with water and the antibacterial soap provided. Be sure to pay special attention to beneath the toenails and in between the toes.  Wash for at least three (3) minutes. Rinse thoroughly with water and dry well with a towel.  Perform this wash unless told not to do so by your physician.  Enclosed: 1 Ice pack (please put in freezer the night before surgery)   1 Hibiclens skin cleaner     Pre-op instructions  If you have any questions regarding the instructions, please do not hesitate to call our office.  [facility]: [address], [place], [place] [address] -- [phone]  [place]: [address] [place], [place] [address] -- [phone]  [place]: [address], [place], [place] [address] -- [phone]   Website: [email or web address] 

## 2019-08-07 NOTE — Progress Notes (Signed)
Subjective: 54 year old female presents the office today for surgical consultation given chronic right foot and ankle pain.  He presented injection in the stretching exercises and rehab without any significant improvement.  Due to continued pain on daily basis wants to proceed with surgical intervention. Denies any systemic complaints such as fevers, chills, nausea, vomiting. No acute changes since last appointment, and no other complaints at this time.   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  Plantar fascia.  There is tenderness palpation of this area.  Also tenderness to the lateral aspect the ankle as well as the anterior medial ankle.  There is an increase in anterior drawer on the right side compared to the contralateral extremity.  There is mild edema to this area.  On the anterior medial ankle is almost a lipoma, thigh deposits with mild tenderness.  No erythema or warmth. 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  -We can discuss surgery as well as conservative treatment.  After discussion she wished to proceed with surgical intervention.  We will plan for right foot plantar fibroma excision, ATFL repair and soft tissue mass excision anterior medial ankle -The incision placement as well as the postoperative course was discussed with the patient. I discussed risks of the surgery which include, but not limited to, infection, bleeding, pain, swelling, need for further surgery, delayed or nonhealing, painful or ugly scar, numbness or sensation changes, over/under correction, recurrence, transfer lesions, further deformity, hardware failure, DVT/PE, loss of toe/foot. Patient  understands these risks and wishes to proceed with surgery. The surgical consent was reviewed with the patient all 3 pages were signed. No promises or guarantees were given to the outcome of the procedure. All questions were answered to the best of my ability. Before the surgery the patient was encouraged to call the office if there is any further questions. The surgery will be performed at the [facility] on an outpatient basis. -CAM boot dispensed for postop use.  -Knee scooter for postop to be ordered.  -Patient encouraged to call the office with any questions, concerns, change in symptoms.   Trula Slade DPM

## 2019-08-08 ENCOUNTER — Telehealth: Payer: Self-pay | Admitting: *Deleted

## 2019-08-08 DIAGNOSIS — T148XXA Other injury of unspecified body region, initial encounter: Secondary | ICD-10-CM

## 2019-08-08 DIAGNOSIS — M722 Plantar fascial fibromatosis: Secondary | ICD-10-CM

## 2019-08-08 NOTE — Telephone Encounter (Signed)
DOS 08/15/2019 EXC. BENIGN LESION 1.0 CM - 11421, PLANTAR FIBROMA - 73344, AND ATFL REPAIR - [phone] OF THE RIGHT FOOT  [employer]: Eligibility Date - 10/04/2018 - 10/03/2019  Plan Deductible Per Service Year $2,151.27 of $3,000.00 Met Remaining: $848.73  Out-of-Pocket Maximum Per Service Year $2,151.27 of $5,300.00 Met Remaining: $3,148.73  Co-Insurance 100%   NOTIFICATION/PRIOR AUTHORIZATION NUMBER [id number]   OVERALL COVERAGE STATUS Covered/Approved 1-3 CODE DESCRIPTION COVERAGE STATUS DECISION DATE [facility]AC [facility] Coverage determination is reflected for the facility admission and is not a guarantee of payment for ongoing services. Covered/Approved 08/07/2019 1 27695 Repair, primary, disrupted ligament, ank more Covered/Approved 08/07/2019 2 28062 Fasciectomy, plantar fascia; radical (se more Covered/Approved 08/07/2019 3 11421 Excision, benign lesion including margin more Covered/Approved 08/07/2019

## 2019-08-11 ENCOUNTER — Other Ambulatory Visit: Payer: Self-pay | Admitting: Podiatry

## 2019-08-13 NOTE — Telephone Encounter (Signed)
No, please lets hold off on this until after surgery.

## 2019-08-15 ENCOUNTER — Encounter: Payer: Self-pay | Admitting: Podiatry

## 2019-08-15 ENCOUNTER — Other Ambulatory Visit: Payer: Self-pay | Admitting: Podiatry

## 2019-08-15 DIAGNOSIS — D2371 Other benign neoplasm of skin of right lower limb, including hip: Secondary | ICD-10-CM

## 2019-08-15 DIAGNOSIS — D492 Neoplasm of unspecified behavior of bone, soft tissue, and skin: Secondary | ICD-10-CM

## 2019-08-15 DIAGNOSIS — T148XXA Other injury of unspecified body region, initial encounter: Secondary | ICD-10-CM

## 2019-08-15 MED ORDER — OXYCODONE-ACETAMINOPHEN 5-325 MG PO TABS: 1.0000 | ORAL_TABLET | Freq: Four times a day (QID) | ORAL | 0 refills | Status: DC | PRN

## 2019-08-15 MED ORDER — DOXYCYCLINE HYCLATE 100 MG PO TABS: 100.0000 mg | ORAL_TABLET | Freq: Two times a day (BID) | ORAL | 0 refills | Status: DC

## 2019-08-15 MED ORDER — PROMETHAZINE HCL 25 MG PO TABS: 25.0000 mg | ORAL_TABLET | Freq: Three times a day (TID) | ORAL | 0 refills | Status: DC | PRN

## 2019-08-15 NOTE — Progress Notes (Signed)
Post-op medications sent to the pharmacy.  

## 2019-08-16 ENCOUNTER — Telehealth: Payer: Self-pay | Admitting: Podiatry

## 2019-08-16 NOTE — Telephone Encounter (Signed)
I called the patient to see how she was doing after surgery. She is doing well without any issues. The nerve block is wearing off. She is taking pain medication and the pain is controlled. Remain NWB, 81mg  ASA daily. Denies any fevers, chills, chest pain, SOB. No further questions or concerns. Follow up as scheduled or sooner if needed

## 2019-08-17 ENCOUNTER — Telehealth: Payer: Self-pay | Admitting: *Deleted

## 2019-08-17 NOTE — Telephone Encounter (Signed)
Called and spoke with the patient and the patient's feeling is coming back into the foot and patient states the nerve block is wearing off and stated that Dr Jacqualyn Posey called last night and stated to take one of the pain medicines at lunch and the other in the pm and also do a 81 mg aspirin and the patient stated that she was still on the antibiotics and there was not any fever or chills and no nausea and I stated to call the office if any concerns or questions and that we would see patient next week for the post surgery check. Lattie Haw

## 2019-08-21 ENCOUNTER — Ambulatory Visit (INDEPENDENT_AMBULATORY_CARE_PROVIDER_SITE_OTHER): Payer: 59

## 2019-08-21 ENCOUNTER — Ambulatory Visit (INDEPENDENT_AMBULATORY_CARE_PROVIDER_SITE_OTHER): Payer: 59 | Admitting: Podiatry

## 2019-08-21 ENCOUNTER — Encounter: Payer: Self-pay | Admitting: Podiatry

## 2019-08-21 ENCOUNTER — Other Ambulatory Visit: Payer: Self-pay

## 2019-08-21 DIAGNOSIS — Z9889 Other specified postprocedural states: Secondary | ICD-10-CM

## 2019-08-21 DIAGNOSIS — M7989 Other specified soft tissue disorders: Secondary | ICD-10-CM

## 2019-08-21 DIAGNOSIS — M722 Plantar fascial fibromatosis: Secondary | ICD-10-CM

## 2019-08-21 DIAGNOSIS — T148XXA Other injury of unspecified body region, initial encounter: Secondary | ICD-10-CM

## 2019-08-22 NOTE — Progress Notes (Signed)
Subjective: Vickie Wyatt is a 54 y.o. is seen today in office s/p right plantar fibroma excision, lipoma excision and ATFL repair preformed on 08/15/2019. They state their pain is controlled.  She is taking pain medicine 1 pill twice a day.  She has been nonweightbearing in the cam boot.  She has been taking a daily aspirin as well. Denies any systemic complaints such as fevers, chills, nausea, vomiting. No calf pain, chest pain, shortness of breath.   Objective: General: No acute distress, AAOx3  DP/PT pulses palpable 2/4, CRT < 3 sec to all digits.  Protective sensation intact. Motor function intact.  RIGHT foot: Incision is well coapted without any evidence of dehiscence with sutures, staples intact. There is no surrounding erythema, ascending cellulitis, fluctuance, crepitus, malodor, drainage/purulence. There is mild edema around the surgical site. There is mild pain along the surgical site.  No other areas of tenderness to bilateral lower extremities.  No other open lesions or pre-ulcerative lesions.  No pain with calf compression, swelling, warmth, erythema.   Assessment and Plan:  Status post right foot surgery, doing well with no complications   -Treatment options discussed including all alternatives, risks, and complications -X-rays obtained reviewed of the ankle.  There is no evidence of acute fracture. -Antibiotic ointment and a bandage was applied.  Keep the dressing clean, dry, intact -Remain nonweightbearing -Ice/elevation -81mg  ASA daily.  -Pain medication as needed. -Monitor for any clinical signs or symptoms of infection and DVT/PE and directed to call the office immediately should any occur or go to the ER. -Follow-up as scheduled for possible suture/staple removal.  Or sooner if any problems arise. In the meantime, encouraged to call the office with any questions, concerns, change in symptoms.   Celesta Gentile, DPM

## 2019-08-25 ENCOUNTER — Other Ambulatory Visit: Payer: Self-pay | Admitting: Podiatry

## 2019-08-25 ENCOUNTER — Encounter: Payer: Self-pay | Admitting: Podiatry

## 2019-08-25 MED ORDER — OXYCODONE-ACETAMINOPHEN 5-325 MG PO TABS: 1.0000 | ORAL_TABLET | Freq: Four times a day (QID) | ORAL | 0 refills | Status: DC | PRN

## 2019-08-25 NOTE — Progress Notes (Signed)
PRN postop pain 

## 2019-08-30 ENCOUNTER — Other Ambulatory Visit: Payer: Self-pay

## 2019-08-30 ENCOUNTER — Ambulatory Visit (INDEPENDENT_AMBULATORY_CARE_PROVIDER_SITE_OTHER): Payer: 59 | Admitting: Podiatry

## 2019-08-30 ENCOUNTER — Encounter: Payer: Self-pay | Admitting: Podiatry

## 2019-08-30 VITALS — BP 133/84 | HR 61 | Temp 97.2°F | Resp 16

## 2019-08-30 DIAGNOSIS — M722 Plantar fascial fibromatosis: Secondary | ICD-10-CM

## 2019-08-30 DIAGNOSIS — T148XXA Other injury of unspecified body region, initial encounter: Secondary | ICD-10-CM

## 2019-08-30 DIAGNOSIS — Z9889 Other specified postprocedural states: Secondary | ICD-10-CM

## 2019-09-09 NOTE — Progress Notes (Signed)
Subjective: Vickie Wyatt is a 54 y.o. is seen today in office s/p right plantar fibroma excision, lipoma excision and ATFL repair preformed on 08/15/2019.  She states that she is feeling well and overall doing better.  The boot seems to be helping.  Other than that she is having no concerns.  She denies any systemic concerns including fevers, chills, nausea, vomiting. No calf pain, chest pain, shortness of breath.   Objective: General: No acute distress, AAOx3  DP/PT pulses palpable 2/4, CRT < 3 sec to all digits.  Protective sensation intact. Motor function intact.  RIGHT foot: Incision is well coapted without any evidence of dehiscence with sutures, staples intact.  There is no surrounding erythema, drainage or pus or any clinical signs of infection noted.  There is minimal edema.  Mild tenderness palpation surgical sites. No other areas of tenderness to bilateral lower extremities.  No other open lesions or pre-ulcerative lesions.  No pain with calf compression, swelling, warmth, erythema.   Assessment and Plan:  Status post right foot surgery, doing well with no complications   -Treatment options discussed including all alternatives, risks, and complications -I removed half of the sutures and staples today.  Incision remained well coapted but left the remainder intact to ensure adequate healing.  Antibiotic ointment was applied followed by dry sterile dressing.  Keep the dressing clean, dry, intact.  She had changed the bandage and there was gauze fibers present within the incisions but I declined today. -Remain nonweightbearing in cam boot -Continue to ice elevate -81mg  ASA daily.  -Pain medication as needed. -Monitor for any clinical signs or symptoms of infection and DVT/PE and directed to call the office immediately should any occur or go to the ER. -Follow-up as scheduled for suture/staple removal.  Or sooner if any problems arise. In the meantime, encouraged to call the office with any  questions, concerns, change in symptoms.   Celesta Gentile, DPM

## 2019-09-13 ENCOUNTER — Other Ambulatory Visit: Payer: Self-pay

## 2019-09-13 ENCOUNTER — Ambulatory Visit (INDEPENDENT_AMBULATORY_CARE_PROVIDER_SITE_OTHER): Payer: 59 | Admitting: Podiatry

## 2019-09-13 ENCOUNTER — Encounter: Payer: Self-pay | Admitting: Podiatry

## 2019-09-13 DIAGNOSIS — Z9889 Other specified postprocedural states: Secondary | ICD-10-CM

## 2019-09-13 DIAGNOSIS — M722 Plantar fascial fibromatosis: Secondary | ICD-10-CM

## 2019-09-13 DIAGNOSIS — T148XXA Other injury of unspecified body region, initial encounter: Secondary | ICD-10-CM

## 2019-09-21 ENCOUNTER — Telehealth: Payer: Self-pay | Admitting: Podiatry

## 2019-09-21 NOTE — Telephone Encounter (Signed)
Hello, I need office note for pts 09/13/2019 visit, whenever you get a chance. You extended her RTW until 11/08/2019. [employer]

## 2019-09-23 NOTE — Progress Notes (Signed)
Subjective: Vickie Wyatt is a 54 y.o. is seen today in office s/p right plantar fibroma excision, lipoma excision and ATFL repair preformed on 08/15/2019.  She presents today for suture, staple removal.  Overall she said that she is doing a lot better.  She denies any recent injury, she has been nonweightbearing as much as possible in the cam boot.  Denies any fevers, chills, nausea, vomiting.  No calf pain, chest pain, shortness of breath.   Objective: General: No acute distress, AAOx3  DP/PT pulses palpable 2/4, CRT < 3 sec to all digits.  Protective sensation intact. Motor function intact.  RIGHT foot: Incision is well coapted without any evidence of dehiscence with sutures, staples intact.  There is no surrounding erythema, drainage or pus or any clinical signs of infection noted.  There is decreased edema.  There is minimal tenderness palpation surgical sites. No other open lesions or pre-ulcerative lesions.  No pain with calf compression, swelling, warmth, erythema.   Assessment and Plan:  Status post right foot surgery, doing well with no complications   -Treatment options discussed including all alternatives, risks, and complications -Sutures, staples removed complications none without any dehiscence.  Steri-Strips were applied for reinforcement followed by a small amount of antibiotic ointment and a bandage.  She can start to shower in 2 days as long as incision is healing well.  Continue nonweightbearing, ice elevation. -Monitor for any clinical signs or symptoms of infection and directed to call the office immediately should any occur or go to the ER.  Return in about 2 weeks (around 09/27/2019) for post-op check .  Trula Slade DPM

## 2019-09-25 NOTE — Telephone Encounter (Signed)
Note was completed.

## 2019-09-27 ENCOUNTER — Other Ambulatory Visit: Payer: Self-pay

## 2019-09-27 ENCOUNTER — Ambulatory Visit (INDEPENDENT_AMBULATORY_CARE_PROVIDER_SITE_OTHER): Payer: 59 | Admitting: Podiatry

## 2019-09-27 ENCOUNTER — Encounter: Payer: Self-pay | Admitting: Podiatry

## 2019-09-27 VITALS — Temp 97.2°F | Resp 14

## 2019-09-27 DIAGNOSIS — Z9889 Other specified postprocedural states: Secondary | ICD-10-CM

## 2019-09-27 DIAGNOSIS — M722 Plantar fascial fibromatosis: Secondary | ICD-10-CM

## 2019-09-27 DIAGNOSIS — T148XXA Other injury of unspecified body region, initial encounter: Secondary | ICD-10-CM

## 2019-10-01 NOTE — Progress Notes (Signed)
Subjective: Vickie Wyatt is a 54 y.o. is seen today in office s/p right plantar fibroma excision, lipoma excision and ATFL repair preformed on 08/15/2019.  She said that she is doing well.  She still in the cam boot and she is been putting partial weight on it.  Ankle incisions are doing well.  She has noticed in the bottom of her foot the incision itself is somewhat thicker at the distal aspect there is itching.  She describes a red color in the actual incision but no redness of the skin.  No increase in swelling that she has noticed. Denies any fevers, chills, nausea, vomiting.  No calf pain, chest pain, shortness of breath.   Objective: General: No acute distress, AAOx3  DP/PT pulses palpable 2/4, CRT < 3 sec to all digits.  Protective sensation intact. Motor function intact.  RIGHT foot: Incision is well coapted without any evidence of dehiscence and scars are well formed.  The scar on the plantar aspect of the foot is minimally hypertrophic.  There is no surrounding erythema, ascending cellulitis there is no drainage or pus.  Minimal tenderness palpation.  She is describing itching sensation to the incision plantarly. No other open lesions or pre-ulcerative lesions.  No pain with calf compression, swelling, warmth, erythema.   Assessment and Plan:  Status post right foot surgery, doing well with no complications   -Treatment options discussed including all alternatives, risks, and complications -Incisions are all healed with a scar.  She has the scar cream at home that you done previously.  I like for her to start to use this cream on the scars particularly the plantar incision. Also discussed vitamin E cream on the incisions to massage. -Continue the cam boot.  Continue to ice elevate. -Monitor for any clinical signs or symptoms of infection and directed to call the office immediately should any occur or go to the ER.  RTC as scheduled or sooner if needed. Likely refer to PT next appointment    Trula Slade DPM

## 2019-10-11 ENCOUNTER — Other Ambulatory Visit: Payer: Self-pay

## 2019-10-11 ENCOUNTER — Encounter: Payer: Self-pay | Admitting: Podiatry

## 2019-10-11 ENCOUNTER — Ambulatory Visit (INDEPENDENT_AMBULATORY_CARE_PROVIDER_SITE_OTHER): Payer: 59 | Admitting: Podiatry

## 2019-10-11 VITALS — Temp 97.3°F

## 2019-10-11 DIAGNOSIS — T148XXA Other injury of unspecified body region, initial encounter: Secondary | ICD-10-CM

## 2019-10-11 DIAGNOSIS — M722 Plantar fascial fibromatosis: Secondary | ICD-10-CM | POA: Diagnosis not present

## 2019-10-11 DIAGNOSIS — Z9889 Other specified postprocedural states: Secondary | ICD-10-CM

## 2019-10-11 DIAGNOSIS — M7989 Other specified soft tissue disorders: Secondary | ICD-10-CM

## 2019-10-15 NOTE — Progress Notes (Signed)
Subjective: Vickie Wyatt is a 54 y.o. is seen today in office s/p right plantar fibroma excision, lipoma excision and ATFL repair preformed on 08/15/2019.  Overall she said that she is doing better.  She has been walking in the cam boot.  The scar on the bottom of the foot she states is still somewhat red.  Otherwise she is been doing well she denies any recent injury or trauma. Denies any fevers, chills, nausea, vomiting.  No calf pain, chest pain, shortness of breath.   Objective: General: No acute distress, AAOx3  DP/PT pulses palpable 2/4, CRT < 3 sec to all digits.  Protective sensation intact. Motor function intact.  RIGHT foot: Incision is well coapted without any evidence of dehiscence and scars are well formed.  The redness that she is describing is the scar on the bottom of the foot.  Incision is mildly hypertrophic at the distal aspect there is no pain.  There is no erythema to the foot otherwise there is no cellulitis.  No warmth.  Mild edema present to the ankle incisions.  No erythema or warmth of this area either. No other open lesions or pre-ulcerative lesions.  No pain with calf compression, swelling, warmth, erythema.   Assessment and Plan:  Status post right foot surgery, doing well with no complications   -Treatment options discussed including all alternatives, risks, and complications -There is no signs of infection noted today.  At this time recommend physical therapy.  Prescription for [facility] written.  Also Tri-Lock ankle brace dispensed that she can start to transition back into regular shoe.  Discussed gradual transition.  She is out of work until May 6 currently.  Return in about 3 weeks (around 11/01/2019).  Trula Slade DPM    -Incisions are all healed with a scar.  She has the scar cream at home that you done previously.  I like for her to start to use this cream on the scars particularly the plantar incision. Also discussed vitamin E cream  on the incisions to massage. -Continue the cam boot.  Continue to ice elevate. -Monitor for any clinical signs or symptoms of infection and directed to call the office immediately should any occur or go to the ER.  RTC as scheduled or sooner if needed. Likely refer to PT next appointment   Trula Slade DPM

## 2019-10-15 NOTE — Addendum Note (Signed)
Addended by: Cranford Mon R on: 10/15/2019 02:51 PM   Modules accepted: Orders

## 2019-10-16 ENCOUNTER — Telehealth: Payer: Self-pay | Admitting: *Deleted

## 2019-10-16 NOTE — Telephone Encounter (Signed)
Dr Jacqualyn Posey filled out the [facility] form and I put the order in the patient's chart and sent over to [facility]. Lattie Haw

## 2019-10-16 NOTE — Telephone Encounter (Signed)
-----   Message from Trula Slade, DPM sent at 10/15/2019  7:52 AM EDT ----- Did we send over a prescription for [facility] last week for her? If not I need to do it.

## 2019-10-18 ENCOUNTER — Encounter: Payer: Self-pay | Admitting: Podiatry

## 2019-10-23 ENCOUNTER — Encounter: Payer: Self-pay | Admitting: Podiatry

## 2019-11-01 ENCOUNTER — Other Ambulatory Visit: Payer: Self-pay

## 2019-11-01 ENCOUNTER — Ambulatory Visit (INDEPENDENT_AMBULATORY_CARE_PROVIDER_SITE_OTHER): Payer: 59 | Admitting: Podiatry

## 2019-11-01 ENCOUNTER — Encounter: Payer: Self-pay | Admitting: Podiatry

## 2019-11-01 DIAGNOSIS — Z9889 Other specified postprocedural states: Secondary | ICD-10-CM

## 2019-11-01 DIAGNOSIS — T148XXA Other injury of unspecified body region, initial encounter: Secondary | ICD-10-CM

## 2019-11-01 DIAGNOSIS — M722 Plantar fascial fibromatosis: Secondary | ICD-10-CM
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
## 2019-11-06 ENCOUNTER — Encounter: Payer: Self-pay | Admitting: Podiatry

## 2019-11-14 ENCOUNTER — Encounter: Payer: Self-pay | Admitting: Podiatry

## 2019-11-27 ENCOUNTER — Ambulatory Visit (INDEPENDENT_AMBULATORY_CARE_PROVIDER_SITE_OTHER): Payer: 59 | Admitting: Podiatry

## 2019-11-27 ENCOUNTER — Other Ambulatory Visit: Payer: Self-pay

## 2019-11-27 DIAGNOSIS — T148XXA Other injury of unspecified body region, initial encounter: Secondary | ICD-10-CM

## 2019-11-27 DIAGNOSIS — Z9889 Other specified postprocedural states: Secondary | ICD-10-CM

## 2019-11-27 DIAGNOSIS — M722 Plantar fascial fibromatosis: Secondary | ICD-10-CM

## 2019-11-30 ENCOUNTER — Other Ambulatory Visit: Payer: Self-pay | Admitting: Family Medicine

## 2019-11-30 DIAGNOSIS — Z1231 Encounter for screening mammogram for malignant neoplasm of breast: Secondary | ICD-10-CM

## 2019-12-04 NOTE — Progress Notes (Signed)
Subjective: Vickie Wyatt is a 54 y.o. is seen today in office s/p right plantar fibroma excision, lipoma excision and ATFL repair preformed on 08/15/2019.  States that she is doing well.  No significant pain.  She has an occasional swelling after work this is improving.  Still using a Tri-Lock ankle brace.  She is having some back pain but this is been getting better as well.  She thinks this is just coming from compensation.  No recent injury. Denies any fevers, chills, nausea, vomiting.  No calf pain, chest pain, shortness of breath.   Objective: General: No acute distress, AAOx3  DP/PT pulses palpable 2/4, CRT < 3 sec to all digits.  Protective sensation intact. Motor function intact.  RIGHT foot: Incision is well coapted without any evidence of dehiscence and scars are well formed.  Minimal swelling to the surgical sites.  There is no erythema or warmth associated with swelling.  Anterior drawer, talar tilt test is negative.  No tenderness palpation surgical sites at this time. No other open lesions or pre-ulcerative lesions.  No pain with calf compression, swelling, warmth, erythema.   Assessment and Plan:  Status post right foot surgery, doing well with no complications   -Treatment options discussed including all alternatives, risks, and complications -She is continue to improve.  I want her to continue range of motion, rehab exercises.  Continue supportive shoes and Tri-Lock ankle brace for now.  As she continues to improve she can stop wearing the ankle brace.  Continue with compression sock.  Ice elevation after work. -Back pain seems to improving.  We will continue to monitor for the referral today but she wants to hold off.  Return in about 6 weeks (around 01/08/2020).  Trula Slade DPM

## 2019-12-18 ENCOUNTER — Encounter: Payer: Self-pay | Admitting: Podiatry

## 2020-01-11 ENCOUNTER — Ambulatory Visit
Admission: RE | Admit: 2020-01-11 | Discharge: 2020-01-11 | Disposition: A | Discharge status: Home / Self Care | Payer: 59 | Source: Ambulatory Visit | Attending: Family Medicine | Admitting: Family Medicine

## 2020-01-11 ENCOUNTER — Other Ambulatory Visit: Payer: Self-pay

## 2020-01-11 DIAGNOSIS — Z1231 Encounter for screening mammogram for malignant neoplasm of breast: Secondary | ICD-10-CM

## 2020-01-15 ENCOUNTER — Ambulatory Visit (INDEPENDENT_AMBULATORY_CARE_PROVIDER_SITE_OTHER): Payer: 59 | Admitting: Podiatry

## 2020-01-15 ENCOUNTER — Other Ambulatory Visit: Payer: Self-pay

## 2020-01-15 DIAGNOSIS — M722 Plantar fascial fibromatosis: Secondary | ICD-10-CM

## 2020-01-15 DIAGNOSIS — Z9889 Other specified postprocedural states: Secondary | ICD-10-CM

## 2020-01-15 DIAGNOSIS — T148XXA Other injury of unspecified body region, initial encounter: Secondary | ICD-10-CM

## 2020-01-20 NOTE — Progress Notes (Signed)
Subjective: Vickie Wyatt is a 54 y.o. is seen today in office s/p right plantar fibroma excision, lipoma excision and ATFL repair preformed on 08/15/2019.  Overall she states that she is feeling better.  She is back to regular shoes and she is back to work full-time.  She has an occasional discomfort towards the day but she thinks that she is doing well.  Denies any fevers, chills, nausea, vomiting.  No calf pain, chest pain, shortness of breath.   Objective: General: No acute distress, AAOx3  DP/PT pulses palpable 2/4, CRT < 3 sec to all digits.  Protective sensation intact. Motor function intact.  RIGHT foot: Incision is well coapted without any evidence of dehiscence and scars are well formed.  There is minimal swelling to the surgical sites.  No erythema.  No significant discomfort to palpation to the surgical sites.  Flexor, extensor tendons appear to be intact. No other open lesions or pre-ulcerative lesions.  No pain with calf compression, swelling, warmth, erythema.   Assessment and Plan:  Status post right foot surgery, doing well with no complications   -Treatment options discussed including all alternatives, risks, and complications -She feels that she is doing well and she has been able to do regular activities with any significant discomfort.  She is wearing regular shoes and back at work full-time.  This plan to discharge her in the postoperative course.  Should there be any changes to the medial and she agrees with this plan.  No follow-ups on file.  Trula Slade DPM

## 2020-02-18 ENCOUNTER — Encounter: Payer: Self-pay | Admitting: Podiatry

## 2020-09-23 ENCOUNTER — Encounter: Payer: Self-pay | Admitting: Podiatry

## 2020-11-28 ENCOUNTER — Other Ambulatory Visit: Payer: Self-pay | Admitting: Family Medicine

## 2020-11-28 DIAGNOSIS — Z1231 Encounter for screening mammogram for malignant neoplasm of breast: Secondary | ICD-10-CM

## 2021-01-27 ENCOUNTER — Other Ambulatory Visit: Payer: Self-pay

## 2021-01-27 ENCOUNTER — Ambulatory Visit
Admission: RE | Admit: 2021-01-27 | Discharge: 2021-01-27 | Disposition: A | Discharge status: Home / Self Care | Payer: 59 | Source: Ambulatory Visit | Attending: Family Medicine | Admitting: Family Medicine

## 2021-01-27 DIAGNOSIS — Z1231 Encounter for screening mammogram for malignant neoplasm of breast: Secondary | ICD-10-CM

## 2021-02-17 ENCOUNTER — Ambulatory Visit: Payer: 59 | Admitting: Podiatry

## 2021-02-17 ENCOUNTER — Encounter: Payer: Self-pay | Admitting: Podiatry

## 2021-02-17 ENCOUNTER — Other Ambulatory Visit: Payer: Self-pay

## 2021-02-17 DIAGNOSIS — T148XXA Other injury of unspecified body region, initial encounter: Secondary | ICD-10-CM

## 2021-02-17 DIAGNOSIS — M7989 Other specified soft tissue disorders: Secondary | ICD-10-CM

## 2021-02-17 DIAGNOSIS — M722 Plantar fascial fibromatosis: Secondary | ICD-10-CM

## 2021-02-25 NOTE — Progress Notes (Signed)
Subjective: 55 year old female presents the office today requesting orthotics.  She states that overall she is doing well not having any concerns to her feet but she had to get orthotics to prevent any issues.  She is walking better wearing normal shoes without any pain.  No new concerns otherwise.  Objective: AAO x3, NAD DP/PT pulses palpable bilaterally, CRT less than 3 seconds Incisions from prior surgeries are all well-healed.  There is no significant tenderness palpation at surgical sites.  No other areas of discomfort.  MMT 5/5.  No edema, erythema. No pain with calf compression, swelling, warmth, erythema  Assessment: History of plantar fascial/plantar fibromatosis with ATFL repair  Plan: -All treatment options discussed with the patient including all alternatives, risks, complications.  -She was measured for custom orthotics today.  Discussed general stretching exercises also and supportive shoe gear. -Patient encouraged to call the office with any questions, concerns, change in symptoms.   Trula Slade DPM

## 2021-03-02 ENCOUNTER — Telehealth: Payer: Self-pay | Admitting: Podiatry

## 2021-03-02 NOTE — Telephone Encounter (Signed)
Orthotics in.. lvm for pt to call to schedule an appt to pick them up. °

## 2021-03-18 ENCOUNTER — Other Ambulatory Visit: Payer: Self-pay

## 2021-03-18 ENCOUNTER — Ambulatory Visit (INDEPENDENT_AMBULATORY_CARE_PROVIDER_SITE_OTHER): Payer: 59 | Admitting: *Deleted

## 2021-03-18 DIAGNOSIS — M722 Plantar fascial fibromatosis: Secondary | ICD-10-CM

## 2021-03-18 NOTE — Progress Notes (Signed)
Patient presents today to pick up custom molded foot orthotics, diagnosed with plantar fasciitis by Dr. Jacqualyn Posey.   Orthotics were dispensed and fit was satisfactory. Reviewed instructions for break-in and wear. Written instructions given to patient.  Patient will follow up as needed.

## 2021-11-18 DIAGNOSIS — Z79899 Other long term (current) drug therapy: Secondary | ICD-10-CM | POA: Diagnosis not present

## 2021-11-18 DIAGNOSIS — Z0000 Encounter for general adult medical examination without abnormal findings: Secondary | ICD-10-CM | POA: Diagnosis not present

## 2021-11-18 DIAGNOSIS — I10 Essential (primary) hypertension: Secondary | ICD-10-CM | POA: Diagnosis not present

## 2021-11-18 DIAGNOSIS — E559 Vitamin D deficiency, unspecified: Secondary | ICD-10-CM | POA: Diagnosis not present

## 2021-12-09 DIAGNOSIS — I10 Essential (primary) hypertension: Secondary | ICD-10-CM | POA: Diagnosis not present

## 2022-01-08 ENCOUNTER — Other Ambulatory Visit: Payer: Self-pay | Admitting: Family Medicine

## 2022-01-08 DIAGNOSIS — Z1231 Encounter for screening mammogram for malignant neoplasm of breast: Secondary | ICD-10-CM

## 2022-01-13 DIAGNOSIS — Z1231 Encounter for screening mammogram for malignant neoplasm of breast: Secondary | ICD-10-CM

## 2022-02-03 ENCOUNTER — Ambulatory Visit
Admission: RE | Admit: 2022-02-03 | Discharge: 2022-02-03 | Disposition: A | Discharge status: Home / Self Care | Payer: BC Managed Care – PPO | Source: Ambulatory Visit

## 2022-02-03 DIAGNOSIS — Z1231 Encounter for screening mammogram for malignant neoplasm of breast: Secondary | ICD-10-CM | POA: Diagnosis not present

## 2022-02-05 ENCOUNTER — Other Ambulatory Visit: Payer: Self-pay | Admitting: Family Medicine

## 2022-02-05 DIAGNOSIS — R928 Other abnormal and inconclusive findings on diagnostic imaging of breast: Secondary | ICD-10-CM

## 2022-02-16 ENCOUNTER — Ambulatory Visit (INDEPENDENT_AMBULATORY_CARE_PROVIDER_SITE_OTHER): Payer: BC Managed Care – PPO | Admitting: Podiatry

## 2022-02-16 DIAGNOSIS — M792 Neuralgia and neuritis, unspecified: Secondary | ICD-10-CM

## 2022-02-16 DIAGNOSIS — M722 Plantar fascial fibromatosis: Secondary | ICD-10-CM | POA: Diagnosis not present

## 2022-02-16 DIAGNOSIS — L905 Scar conditions and fibrosis of skin: Secondary | ICD-10-CM

## 2022-02-16 MED ORDER — TRIAMCINOLONE ACETONIDE 10 MG/ML IJ SUSP
10.0000 mg | Freq: Once | INTRAMUSCULAR | Status: AC
  Administered 2022-02-16: 10 mg

## 2022-02-16 NOTE — Patient Instructions (Signed)

## 2022-02-22 ENCOUNTER — Ambulatory Visit: Admission: RE | Admit: 2022-02-22 | Payer: BC Managed Care – PPO | Source: Ambulatory Visit

## 2022-02-22 ENCOUNTER — Other Ambulatory Visit: Payer: Self-pay | Admitting: Family Medicine

## 2022-02-22 ENCOUNTER — Ambulatory Visit
Admission: RE | Admit: 2022-02-22 | Discharge: 2022-02-22 | Disposition: A | Discharge status: Home / Self Care | Payer: BC Managed Care – PPO | Source: Ambulatory Visit | Attending: Family Medicine | Admitting: Family Medicine

## 2022-02-22 DIAGNOSIS — R928 Other abnormal and inconclusive findings on diagnostic imaging of breast: Secondary | ICD-10-CM

## 2022-02-22 DIAGNOSIS — R921 Mammographic calcification found on diagnostic imaging of breast: Secondary | ICD-10-CM | POA: Diagnosis not present

## 2022-02-23 NOTE — Progress Notes (Signed)
Subjective: Chief Complaint  Patient presents with   Foot Pain     plantar fascial/plantar fibromatosis with ATFL repair, Right foot, Rate pain 4 out of 10, sharp ache that goes up the leg,      56 year old female presents the office today with the above complaints.  She states that she gets pain on the bottom of her right foot along the edge of the incision the symptoms will travel up her leg.  Described as sharp pain.  She feels it is on the incision.  No recent injury or changes otherwise.  Objective: AAO x3, NAD DP/PT pulses palpable bilaterally, CRT less than 3 seconds Incisions from prior surgeries are all well-healed.  Mild recurrence of the plantar fibroma on the right foot and there is some thickening of the scar present.  Plantar fibroma present on the left foot this is a firm nonmobile lesion.  Minimal discomfort.  There is negative Tinel sign bilaterally.  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  -Steroid injection was performed the right side.  Skin was cleaned with alcohol and mixture of 1 cc Kenalog 10, 0.5 cc Marcaine plain, 0.5 cc of lidocaine plain was infiltrated into the soft tissue mass without complications.  Postinjection care discussed.  Tolerated well. -I ordered a compound cream through [facility] for scarring. -Continue stretching exercises daily as well as arch supports.  Trula Slade DPM

## 2022-03-11 DIAGNOSIS — R42 Dizziness and giddiness: Secondary | ICD-10-CM | POA: Diagnosis not present

## 2022-03-30 ENCOUNTER — Ambulatory Visit (INDEPENDENT_AMBULATORY_CARE_PROVIDER_SITE_OTHER): Payer: BC Managed Care – PPO | Admitting: Podiatry

## 2022-03-30 DIAGNOSIS — M722 Plantar fascial fibromatosis: Secondary | ICD-10-CM

## 2022-03-30 MED ORDER — TRIAMCINOLONE ACETONIDE 10 MG/ML IJ SUSP
10.0000 mg | Freq: Once | INTRAMUSCULAR | Status: AC
  Administered 2022-03-30: 10 mg

## 2022-03-30 NOTE — Patient Instructions (Signed)

## 2022-04-02 NOTE — Progress Notes (Signed)
Subjective: Chief Complaint  Patient presents with   Foot Pain    Patient came in today for plantar fibromatosis, patient is having some pain off and on, Rate of pain 10 out of 10, feet cramp, patient states that she is doing much better, TX: injection (has helped)      56 year old female presents the office today with the above complaints.  She said that she is doing better.  Still gets a sharp pain at times.  Injection was helpful last appointment.  She is that she can try to work on her schedule at work to even out the hours which has been helpful as well.  Objective: AAO x3, NAD DP/PT pulses palpable bilaterally, CRT less than 3 seconds Incisions from prior surgeries are all well-healed.  Scar present on the right foot with no significant noticeable plantar fibroma or soft tissue mass in the right foot.  The plantar fibroma present on the left foot appears to be smaller and more soft in nature.  No edema, erythema.  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  -Steroid injection was performed the left side.  Skin was cleaned with alcohol and mixture of 1 cc Kenalog 10, 0.5 cc Marcaine plain, 0.5 cc of lidocaine plain was infiltrated into the soft tissue mass without complications.  Postinjection care discussed.  Tolerated well. -Continue the compound cream through [facility] for scarring. -Continue stretching exercises daily as well as arch supports.  Trula Slade DPM

## 2022-08-26 ENCOUNTER — Ambulatory Visit
Admission: RE | Admit: 2022-08-26 | Discharge: 2022-08-26 | Disposition: A | Discharge status: Home / Self Care | Payer: BC Managed Care – PPO | Source: Ambulatory Visit | Attending: Family Medicine | Admitting: Family Medicine

## 2022-08-26 DIAGNOSIS — R921 Mammographic calcification found on diagnostic imaging of breast: Secondary | ICD-10-CM | POA: Diagnosis not present

## 2022-08-27 ENCOUNTER — Other Ambulatory Visit: Payer: Self-pay | Admitting: Family Medicine

## 2022-08-27 DIAGNOSIS — R921 Mammographic calcification found on diagnostic imaging of breast: Secondary | ICD-10-CM

## 2022-11-25 DIAGNOSIS — Z79899 Other long term (current) drug therapy: Secondary | ICD-10-CM | POA: Diagnosis not present

## 2022-11-25 DIAGNOSIS — Z0000 Encounter for general adult medical examination without abnormal findings: Secondary | ICD-10-CM | POA: Diagnosis not present

## 2022-11-25 DIAGNOSIS — I10 Essential (primary) hypertension: Secondary | ICD-10-CM | POA: Diagnosis not present

## 2022-11-25 DIAGNOSIS — E559 Vitamin D deficiency, unspecified: Secondary | ICD-10-CM | POA: Diagnosis not present

## 2023-03-24 ENCOUNTER — Ambulatory Visit
Admission: RE | Admit: 2023-03-24 | Discharge: 2023-03-24 | Disposition: A | Discharge status: Home / Self Care | Payer: BC Managed Care – PPO | Source: Ambulatory Visit | Attending: Family Medicine | Admitting: Family Medicine

## 2023-03-24 DIAGNOSIS — R921 Mammographic calcification found on diagnostic imaging of breast: Secondary | ICD-10-CM

## 2023-05-04 IMAGING — MG MM DIGITAL SCREENING BILAT W/ TOMO AND CAD
8 of 14 series · 8 of 40 positions shown · non-contrast
Comparison: Previous exam(s).

CLINICAL DATA: Screening.

EXAM:
DIGITAL SCREENING BILATERAL MAMMOGRAM WITH TOMOSYNTHESIS AND CAD
TECHNIQUE: Bilateral screening digital craniocaudal and mediolateral oblique
mammograms were obtained. Bilateral screening digital breast
tomosynthesis was performed. The images were evaluated with
computer-aided detection.

[R CC synth-2D]
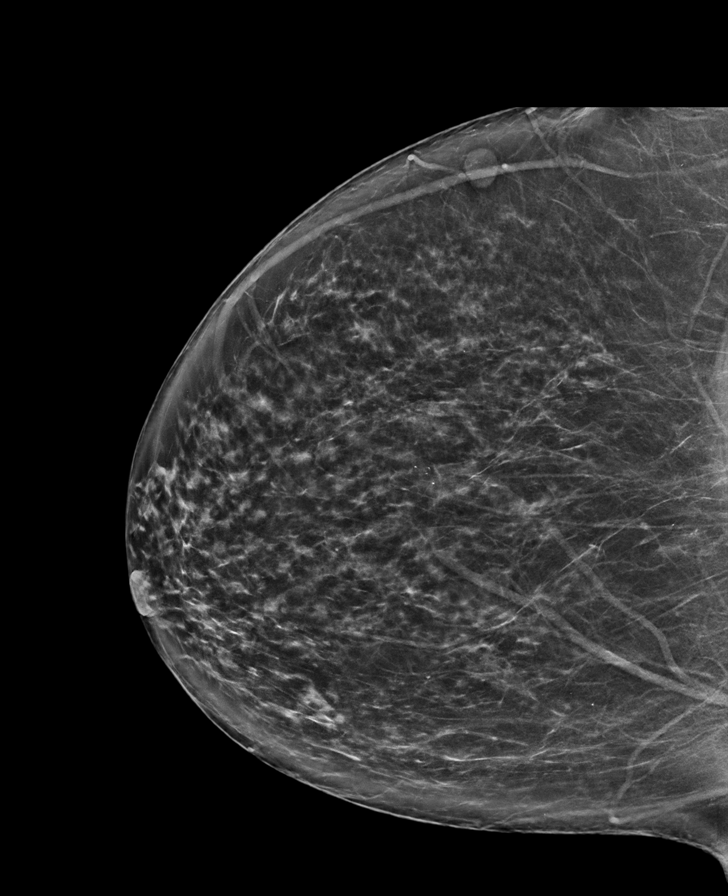

[R MLO synth-2D (1 of 2)]
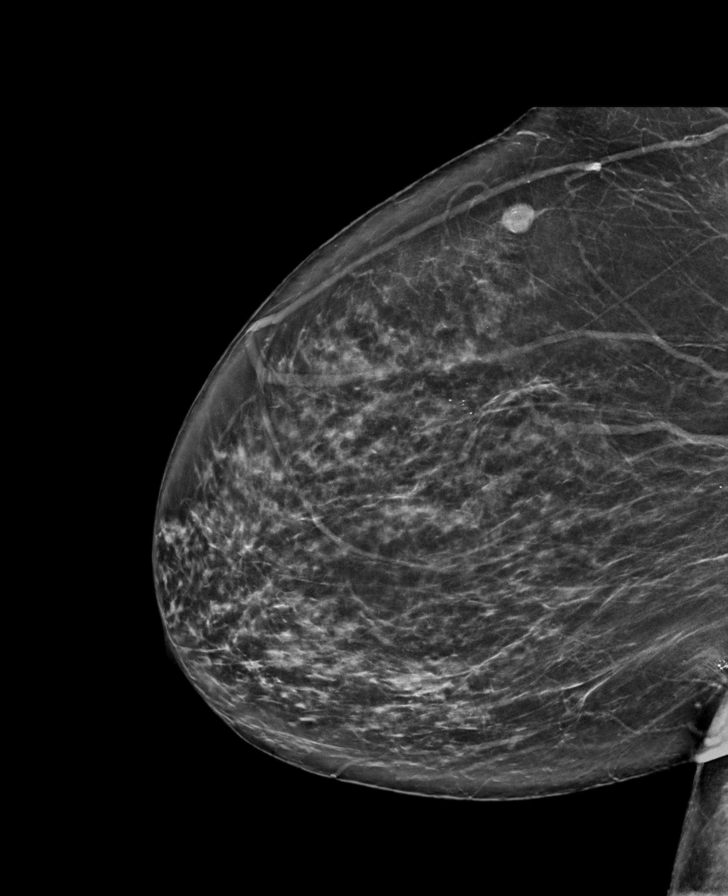

[L CC synth-2D]
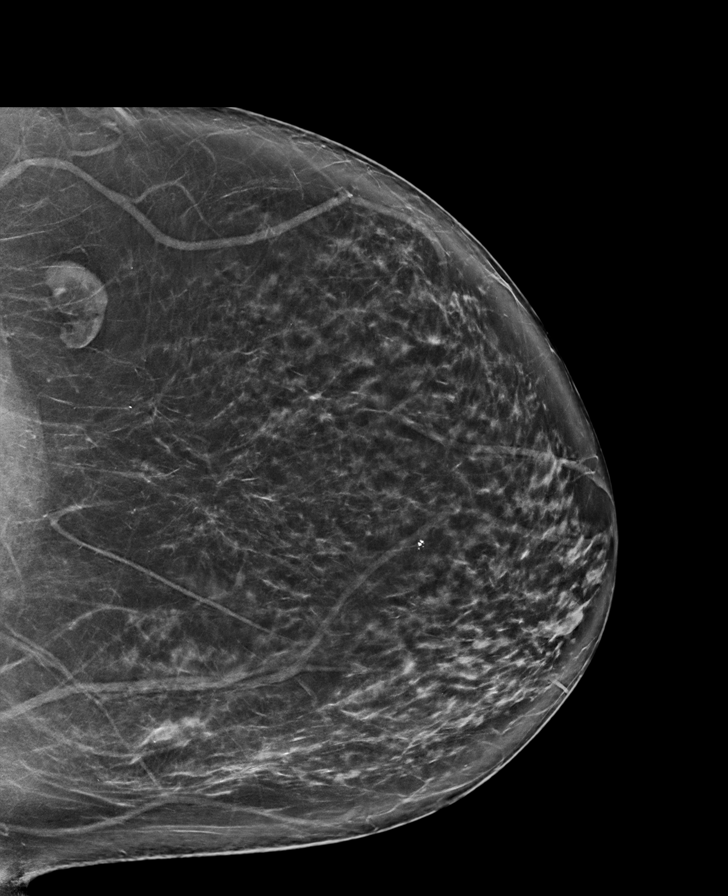

[L MLO synth-2D (1 of 3)]
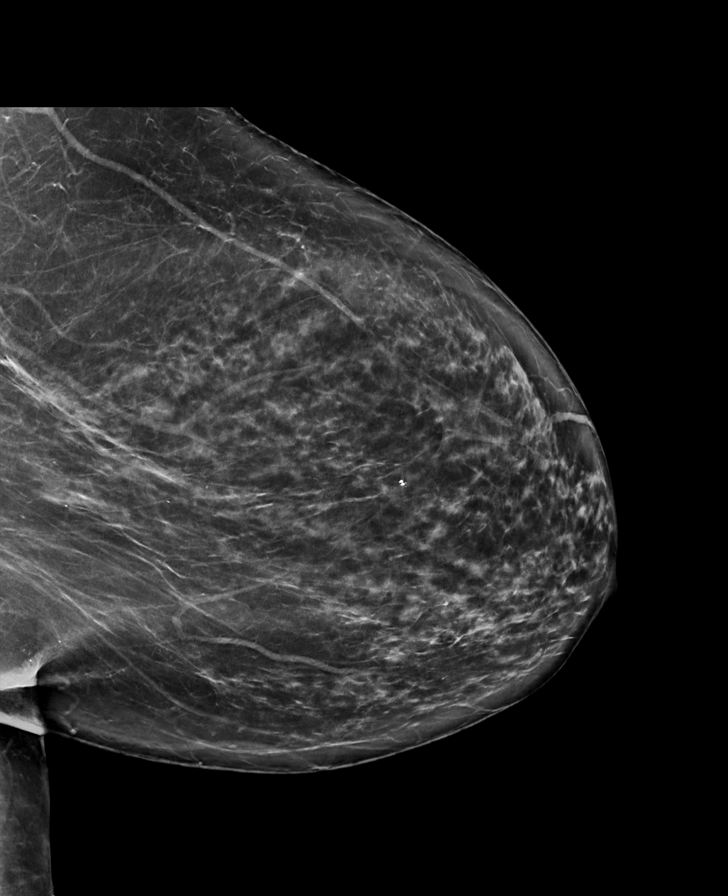

[L MLO synth-2D (2 of 3)]
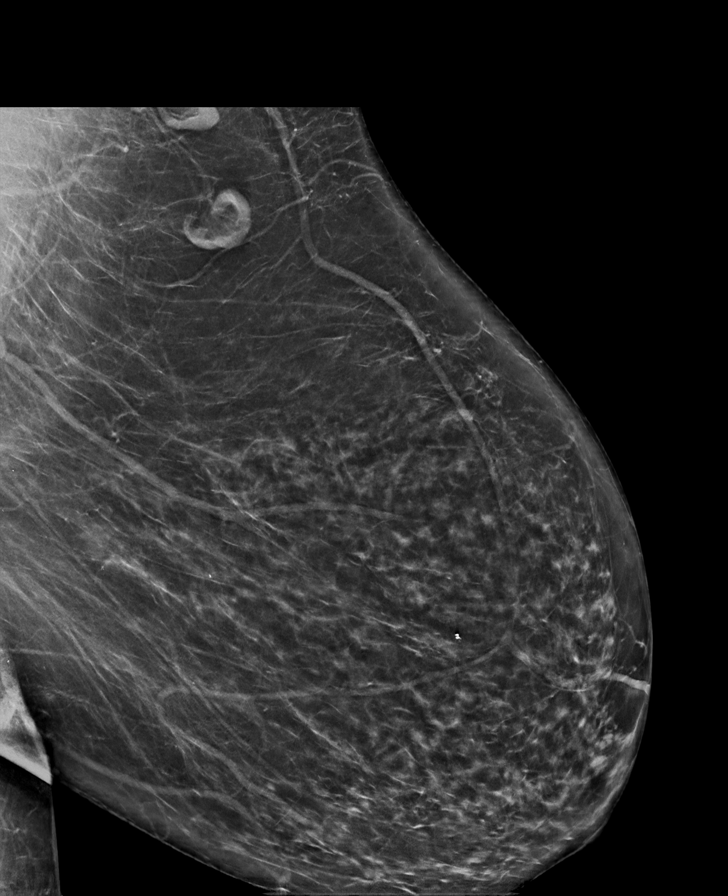

[R MLO synth-2D (2 of 2)]
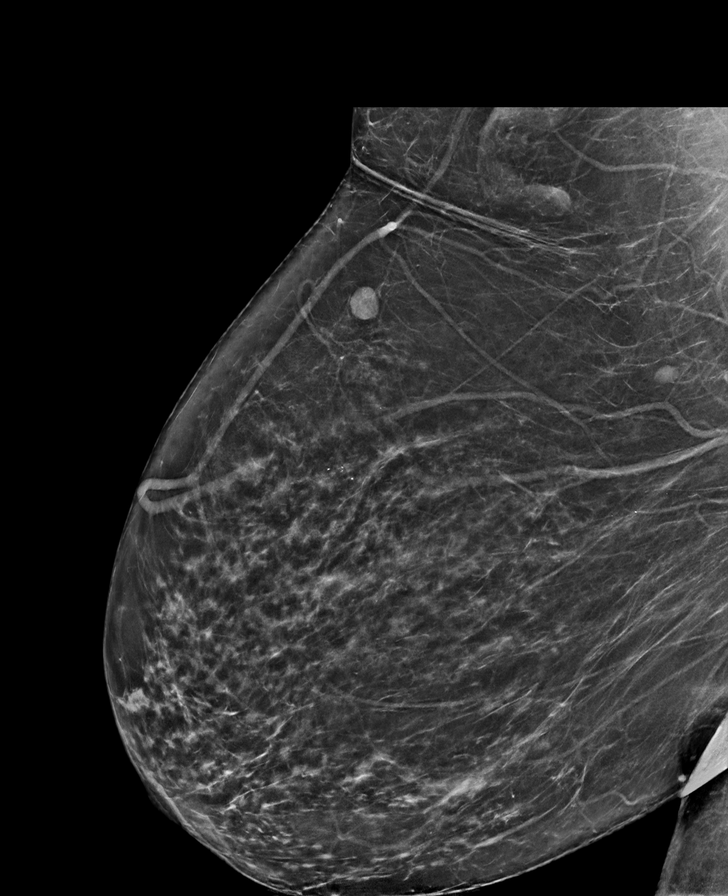

[L MLO synth-2D (3 of 3)]
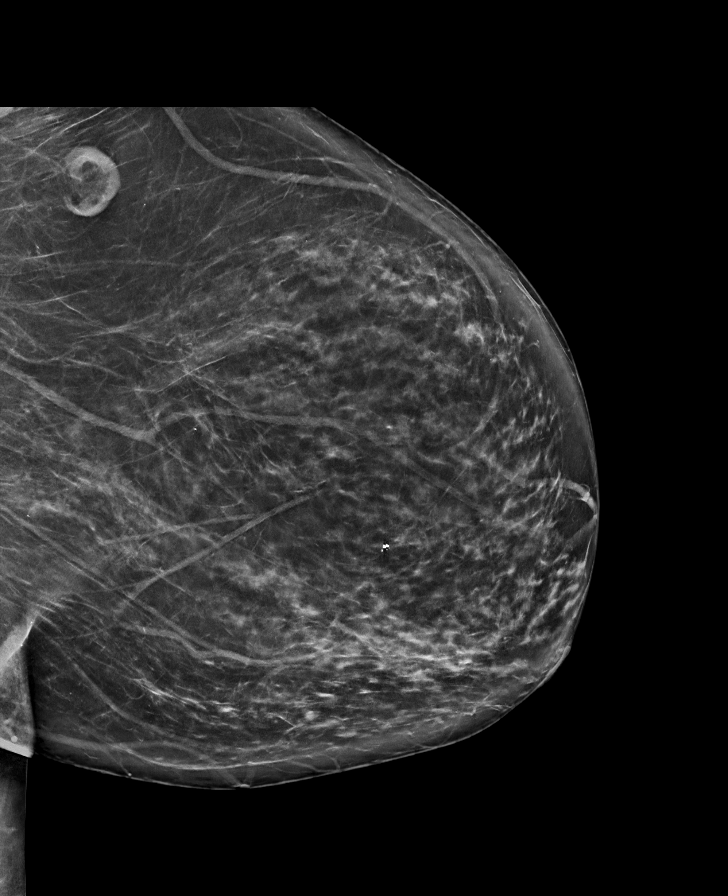

[L CC tomo · tomo slice 44/87.0]
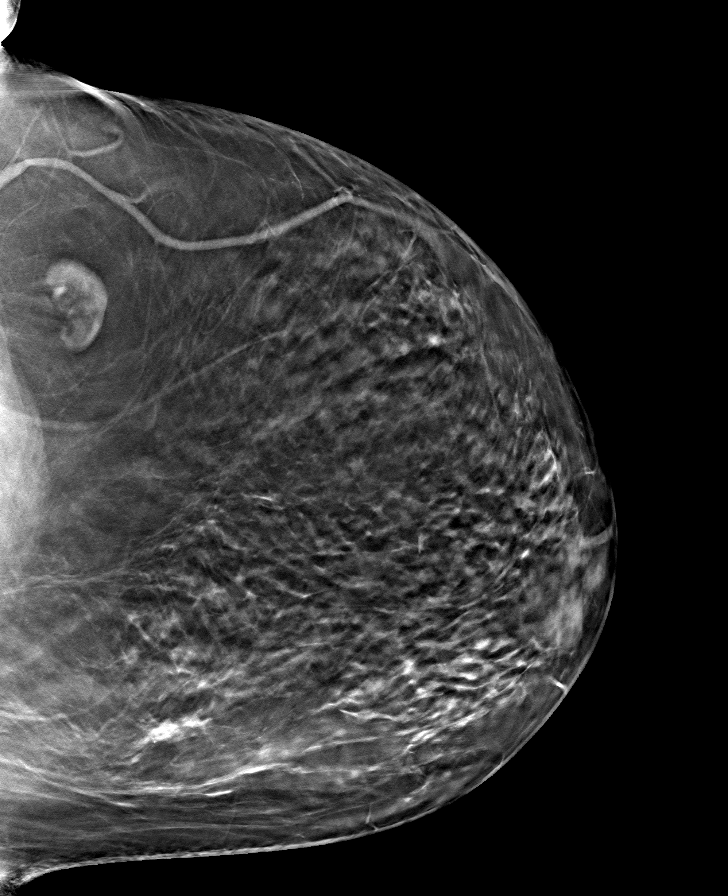

[8 of 40 positions shown; findings below may reference images not displayed]

ACR Breast Density Category b: There are scattered areas of
fibroglandular density.
FINDINGS: There are no findings suspicious for malignancy.
IMPRESSION: No mammographic evidence of malignancy. A result letter of this
screening mammogram will be mailed directly to the patient.

RECOMMENDATION:
Screening mammogram in one year. (Code:51-O-LD2)

BI-RADS CATEGORY  1: Negative.

## 2023-12-13 DIAGNOSIS — E559 Vitamin D deficiency, unspecified: Secondary | ICD-10-CM | POA: Diagnosis not present

## 2023-12-13 DIAGNOSIS — Z79899 Other long term (current) drug therapy: Secondary | ICD-10-CM | POA: Diagnosis not present

## 2023-12-13 DIAGNOSIS — I10 Essential (primary) hypertension: Secondary | ICD-10-CM | POA: Diagnosis not present

## 2023-12-13 DIAGNOSIS — Z0000 Encounter for general adult medical examination without abnormal findings: Secondary | ICD-10-CM | POA: Diagnosis not present

## 2024-02-13 ENCOUNTER — Other Ambulatory Visit: Payer: Self-pay | Admitting: Family Medicine

## 2024-02-13 DIAGNOSIS — R921 Mammographic calcification found on diagnostic imaging of breast: Secondary | ICD-10-CM

## 2024-04-05 ENCOUNTER — Ambulatory Visit

## 2024-04-12 ENCOUNTER — Ambulatory Visit
Admission: RE | Admit: 2024-04-12 | Discharge: 2024-04-12 | Disposition: A | Discharge status: Home / Self Care | Source: Ambulatory Visit | Attending: Family Medicine | Admitting: Family Medicine

## 2024-04-12 DIAGNOSIS — R921 Mammographic calcification found on diagnostic imaging of breast: Secondary | ICD-10-CM
